# Patient Record
Sex: Female | Born: 1975 | Race: White | Hispanic: No | Marital: Married | Smoking: Current every day smoker
Health system: Southern US, Community
[De-identification: ages and names within clinical notes are randomized; demographics above are authoritative.]

## PROBLEM LIST (undated history)

## (undated) DIAGNOSIS — E119 Type 2 diabetes mellitus without complications: Secondary | ICD-10-CM

## (undated) DIAGNOSIS — M549 Dorsalgia, unspecified: Secondary | ICD-10-CM

## (undated) DIAGNOSIS — E282 Polycystic ovarian syndrome: Secondary | ICD-10-CM

## (undated) DIAGNOSIS — N3010 Interstitial cystitis (chronic) without hematuria: Secondary | ICD-10-CM

## (undated) HISTORY — PX: ABDOMINAL HYSTERECTOMY: SHX81

## (undated) HISTORY — PX: SPINAL FUSION: SHX223

## (undated) HISTORY — DX: Interstitial cystitis (chronic) without hematuria: N30.10

## (undated) HISTORY — DX: Polycystic ovarian syndrome: E28.2

## (undated) HISTORY — DX: Dorsalgia, unspecified: M54.9

## (undated) HISTORY — DX: Type 2 diabetes mellitus without complications: E11.9

---

## 1995-04-11 DIAGNOSIS — L680 Hirsutism: Secondary | ICD-10-CM

## 1995-04-11 HISTORY — DX: Hirsutism: L68.0

## 2020-03-13 ENCOUNTER — Other Ambulatory Visit: Payer: Self-pay

## 2020-03-13 ENCOUNTER — Ambulatory Visit (INDEPENDENT_AMBULATORY_CARE_PROVIDER_SITE_OTHER): Payer: Medicare Other | Admitting: Psychiatry

## 2020-03-13 ENCOUNTER — Encounter: Payer: Self-pay | Admitting: Psychiatry

## 2020-03-13 VITALS — BP 138/87 | HR 92 | Ht 64.0 in | Wt 184.0 lb

## 2020-03-13 DIAGNOSIS — F319 Bipolar disorder, unspecified: Secondary | ICD-10-CM | POA: Diagnosis not present

## 2020-03-13 DIAGNOSIS — F4310 Post-traumatic stress disorder, unspecified: Secondary | ICD-10-CM

## 2020-03-13 DIAGNOSIS — F411 Generalized anxiety disorder: Secondary | ICD-10-CM

## 2020-03-13 DIAGNOSIS — G4700 Insomnia, unspecified: Secondary | ICD-10-CM

## 2020-03-13 MED ORDER — ZOLPIDEM TARTRATE 10 MG PO TABS: 10.0000 mg | ORAL_TABLET | Freq: Every day | ORAL | 2 refills | Status: DC

## 2020-03-13 MED ORDER — LURASIDONE HCL 40 MG PO TABS: 40.0000 mg | ORAL_TABLET | Freq: Every day | ORAL | 0 refills | Status: DC

## 2020-03-13 MED ORDER — LATUDA 20 MG PO TABS: 20.0000 mg | ORAL_TABLET | Freq: Every day | ORAL | 0 refills | Status: DC

## 2020-03-13 MED ORDER — BUSPIRONE HCL 15 MG PO TABS: 15.0000 mg | ORAL_TABLET | Freq: Two times a day (BID) | ORAL | 0 refills | Status: DC

## 2020-03-13 MED ORDER — HYDROXYZINE HCL 25 MG PO TABS: 25.0000 mg | ORAL_TABLET | Freq: Every day | ORAL | 0 refills | Status: DC

## 2020-03-13 MED ORDER — BUPROPION HCL ER (XL) 300 MG PO TB24: 300.0000 mg | ORAL_TABLET | Freq: Every morning | ORAL | 0 refills | Status: DC
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
## 2020-04-10 ENCOUNTER — Encounter: Payer: Self-pay | Admitting: Psychiatry

## 2020-04-10 ENCOUNTER — Other Ambulatory Visit: Payer: Self-pay

## 2020-04-10 ENCOUNTER — Ambulatory Visit (INDEPENDENT_AMBULATORY_CARE_PROVIDER_SITE_OTHER): Payer: Medicare Other | Admitting: Psychiatry

## 2020-04-10 DIAGNOSIS — F319 Bipolar disorder, unspecified: Secondary | ICD-10-CM

## 2020-04-10 MED ORDER — LURASIDONE HCL 40 MG PO TABS: 40.0000 mg | ORAL_TABLET | Freq: Every day | ORAL | 1 refills | Status: DC

## 2020-04-10 NOTE — Progress Notes (Signed)
Julie Abbott [id number] 1976/09/04 43 y.o.  Subjective:   Patient ID:  Julie Abbott is a 44 y.o. (DOB 11-Nov-1976) female.  Chief Complaint:  Chief Complaint  Patient presents with  . Follow-up    h/o Anxiety and mood.     HPI Julie Abbott presents to the office today for follow-up of mood and anxiety. Julie Abbott reports that Julie Abbott is now "even keel" with Latuda and has some affective dulling without any excitement or negative emotions. Julie Abbott reports that Julie Abbott has some pain in her UE joints after starting Latuda. Denies depressed mood. Julie Abbott reports that Julie Abbott does not have any positive emotions. Denies irritability. Julie Abbott reports that Julie Abbott has felt groggy at times and concerned about driving. Goes to bed at 9 pm and sleeps about 12 hours a night. Has been taking Latuda in the morning. Appetite has been about the same. Julie Abbott reports that Julie Abbott has gained 10 lbs. Energy has been ok. Motivation has been ok and follows her routines. Julie Abbott reports impaired concentration and has not been able to complete a book that Julie Abbott wants to read or focus on her devices. Denies SI.   Reports that Julie Abbott has not had any manic s/s in 1.5 weeks. Julie Abbott reports that Julie Abbott had some manic s/s around the time of custody issues with granddaughter. Julie Abbott reports h/o manic s/s with increased stressors.   Has some anxiety about going to oral surgeon tomorrow. Julie Abbott reports that Julie Abbott avoids certain triggers that will cause panic or increased anxiety. Avoids crowds even if it is for something Julie Abbott enjoys. Reports that Julie Abbott has certain routines that need to be followed and will have distress if Julie Abbott cannot complete routine or it gets routine.  Reports that her daughter lost custody of her 57 yo child, so Julie Abbott is concerned Julie Abbott will not be able to see grandchild for years to come. Daughter is pregnant and has been having difficulty with stressors. Julie Abbott reports that Julie Abbott has strained relationship with her mother and called her yesterday on Mother's day.   Reports that  her boyfriend is supportive. AIMS     Office Visit from 04/10/2020 in [facility]  AIMS Total Score  3      Past Psychiatric Medication Trials: Ritalin- Prescribed when Julie Abbott was 44 yo. Adderall- was taking before pain medication. Would have "crash" in the afternoon. Latuda- Was taking in [place] at 80 mg BID. Reports that Latuda seemed to decrease frequency of manic s/s. Depression was less. Denies side effect Vraylar- Ineffective Abilify Risperdal Geodon Seroquel- Increased activation. Lamictal- Tolerated ok. Had some arthralgias. Depakote Trileptal Wellbutrin- Has taken long-term. Was increased to 300 mg po qd in the last 1-2 years. Denies side effects. Helps with decreasing smoking. Citalopram Cymbalta Buspar- Started by PCP in [place] after major stressor before moving to [place]. Thinks it is somewhat helpful for anxiety and rumination. Denies side effective.  Ambien- Most effective for sleep. Causes some impulsivity. Has taken for about 10 years Lunesta- ineffective. Melatonin Hydroxyzine Lyrica- Prescribed for pain Gabapentin-Adverse reaction  Review of Systems:  Review of Systems  Endocrine:       Julie Abbott reports improved glycemic control  Musculoskeletal: Positive for arthralgias. Negative for gait problem.  Neurological: Positive for tremors.  Psychiatric/Behavioral:       Please refer to HPI    Medications: I have reviewed the patient's current medications.  Current Outpatient Medications  Medication Sig Dispense Refill  . buPROPion (WELLBUTRIN XL) 300 MG 24 hr tablet Take 1 tablet (300 mg  total) by mouth every morning. 90 tablet 0  . busPIRone (BUSPAR) 15 MG tablet Take 1 tablet (15 mg total) by mouth 2 (two) times daily. 180 tablet 0  . cyclobenzaprine (FLEXERIL) 10 MG tablet 2 (two) times a day as needed.    [name] ELMIRON 100 MG capsule TAKE 1 CAPSULE BY MOUTH 30 MINUTES BEFORE MEALS    . estradiol (CLIMARA) 0.06 MG/24HR 1 patch once a week.    . hydrOXYzine  (ATARAX/VISTARIL) 25 MG tablet Take 1 tablet (25 mg total) by mouth at bedtime. 90 tablet 0  . LANTUS SOLOSTAR 100 UNIT/ML Solostar Pen SMARTSIG:60 Unit(s) SUB-Q Every Night    . losartan (COZAAR) 25 MG tablet Take by mouth.    . lurasidone (LATUDA) 40 MG TABS tablet Take 1 tablet (40 mg total) by mouth daily with supper. 30 tablet 1  . metFORMIN (GLUCOPHAGE) 500 MG tablet Take by mouth.    . oxyCODONE (OXY IR/ROXICODONE) 5 MG immediate release tablet SMARTSIG:1 Tablet(s) By Mouth 6 Times Daily PRN    . pregabalin (LYRICA) 200 MG capsule Take 200 mg by mouth 3 (three) times daily.    . rosuvastatin (CRESTOR) 10 MG tablet Take by mouth.    . tamsulosin (FLOMAX) 0.4 MG CAPS capsule Take by mouth.    [name] VICTOZA 18 MG/3ML SOPN INJECT 0.6MG  SUBCUTANEOUSLY DAILY FOR 1 WEEK THEN INJECT 1.2MG  DAILY    . zolpidem (AMBIEN) 10 MG tablet Take 1 tablet (10 mg total) by mouth at bedtime. 30 tablet 2   No current facility-administered medications for this visit.    Medication Side Effects: Sedation and Other: Joint aches in upper extremities  Allergies:  Allergies  Allergen Reactions  . Bee Venom   . Gabapentin   . Lortab [Hydrocodone-Acetaminophen]     Past Medical History:  Diagnosis Date  . Back pain   . Diabetes mellitus, type II (HCC)   . Interstitial cystitis   . PCOS (polycystic ovarian syndrome)     Family History  Problem Relation Age of Onset  . Bipolar disorder Daughter   . Anxiety disorder Daughter   . Depression Mother   . Alcohol abuse Mother   . Drug abuse Mother   . OCD Mother   . Panic disorder Mother   . Alcohol abuse Maternal Aunt   . Polycystic ovary syndrome Paternal Aunt   . Alcohol abuse Maternal Uncle   . Alcohol abuse Maternal Grandmother   . Anxiety disorder Maternal Grandmother   . Depression Maternal Grandmother   . Polycystic ovary syndrome Paternal Grandmother     Social History   Socioeconomic History  . Marital status: Unknown    Spouse name: Not  on file  . Number of children: Not on file  . Years of education: Not on file  . Highest education level: Not on file  Occupational History  . Not on file  Tobacco Use  . Smoking status: Current Every Day Smoker    Packs/day: 0.25    Types: Cigarettes  . Smokeless tobacco: Never Used  Substance and Sexual Activity  . Alcohol use: Never  . Drug use: Never  . Sexual activity: Not on file  Other Topics Concern  . Not on file  Social History Narrative  . Not on file   Social Determinants of Health   Financial Resource Strain:   . Difficulty of Paying Living Expenses:   Food Insecurity:   . Worried About Programme researcher, broadcasting/film/video in the Last Year:   .  Ran Out of Food in the Last Year:   Transportation Needs:   . Freight forwarder (Medical):   [name] Lack of Transportation (Non-Medical):   Physical Activity:   . Days of Exercise per Week:   . Minutes of Exercise per Session:   Stress:   . Feeling of Stress :   Social Connections:   . Frequency of Communication with Friends and Family:   . Frequency of Social Gatherings with Friends and Family:   . Attends Religious Services:   . Active Member of Clubs or Organizations:   . Attends Banker Meetings:   [name] Marital Status:   Intimate Partner Violence:   . Fear of Current or Ex-Partner:   . Emotionally Abused:   [name] Physically Abused:   . Sexually Abused:     Past Medical History, Surgical history, Social history, and Family history were reviewed and updated as appropriate.   Please see review of systems for further details on the patient's review from today.   Objective:   Physical Exam:  There were no vitals taken for this visit.  Physical Exam Constitutional:      General: Julie Abbott is not in acute distress. Musculoskeletal:        General: No deformity.  Neurological:     Mental Status: Julie Abbott is alert and oriented to person, place, and time.     Coordination: Coordination normal.  Psychiatric:        Attention  and Perception: Attention and perception normal. Julie Abbott does not perceive auditory or visual hallucinations.        Mood and Affect: Mood is anxious. Mood is not depressed. Affect is not labile, blunt, angry or inappropriate.        Speech: Speech normal.        Behavior: Behavior normal.        Thought Content: Thought content normal. Thought content is not paranoid or delusional. Thought content does not include homicidal or suicidal ideation. Thought content does not include homicidal or suicidal plan.        Cognition and Memory: Cognition and memory normal.        Judgment: Judgment normal.     Comments: Insight intact     Lab Review:  No results found for: NA, K, CL, CO2, GLUCOSE, BUN, CREATININE, CALCIUM, PROT, ALBUMIN, AST, ALT, ALKPHOS, BILITOT, GFRNONAA, GFRAA  No results found for: WBC, RBC, HGB, HCT, PLT, MCV, MCH, MCHC, RDW, LYMPHSABS, MONOABS, EOSABS, BASOSABS  No results found for: POCLITH, LITHIUM   No results found for: PHENYTOIN, PHENOBARB, VALPROATE, CBMZ   .res Assessment: Plan:   Julie Abbott reports that Julie Abbott is getting some benefits with Latuda and is also noticing some side effects. Julie Abbott reports that Julie Abbott would like to continue Latuda 40 mg po qd for another month to determine if side effects resolve like they have in the past with Latuda. Continue Latuda 40 mg po qd for mood s/s. Will continue Wellbutrin XL 300 mg po qd for depression. Continue Buspar 15 mg po BID for anxiety.  Continue Hydroxyzine 25 mg po QHS for insomnia and anxiety.  Continue Ambien 10 mg po QHS for insomnia.  Julie Abbott to f/u in 4 weeks or sooner if clinically indicated.  Patient advised to contact office with any questions, adverse effects, or acute worsening in signs and symptoms.  Julie Abbott was seen today for follow-up.  Diagnoses and all orders for this visit:  Bipolar affective disorder, remission status unspecified (HCC) -  lurasidone (LATUDA) 40 MG TABS tablet; Take 1 tablet (40 mg total) by mouth  daily with supper.     Please see After Visit Summary for patient specific instructions.  Future Appointments  Date Time Provider [place]  05/08/2020  8:30 AM Thayer Headings, PMHNP [facility] None    No orders of the defined types were placed in this encounter.   -------------------------------

## 2020-04-10 NOTE — Progress Notes (Signed)
Reports long-standing familial tremor and denies worsening tremor.  04/10/20 1015  Facial and Oral Movements  Muscles of Facial Expression 0  Lips and Perioral Area 0  Jaw 0  Tongue 0  Extremity Movements  Upper (arms, wrists, hands, fingers) 1  Lower (legs, knees, ankles, toes) 0  Trunk Movements  Neck, shoulders, hips 0  Overall Severity  Severity of abnormal movements (highest score from questions above) 0  Incapacitation due to abnormal movements 0  Patient's awareness of abnormal movements (rate only patient's report) 2  AIMS Total Score  AIMS Total Score 3

## 2020-05-08 ENCOUNTER — Ambulatory Visit: Payer: Medicare Other | Admitting: Psychiatry

## 2020-06-06 ENCOUNTER — Telehealth: Payer: Self-pay | Admitting: Psychiatry

## 2020-06-06 NOTE — Telephone Encounter (Signed)
Pt will need a RF of generic Lyrica 600mg  per day. ( from a previous doctor) Please send in to [facility] in [place], [place].

## 2020-06-09 ENCOUNTER — Telehealth: Payer: Self-pay | Admitting: Psychiatry

## 2020-06-09 ENCOUNTER — Other Ambulatory Visit: Payer: Self-pay | Admitting: Physician Assistant

## 2020-06-09 MED ORDER — PREGABALIN 200 MG PO CAPS: 200.0000 mg | ORAL_CAPSULE | Freq: Three times a day (TID) | ORAL | 0 refills | Status: DC

## 2020-06-09 NOTE — Telephone Encounter (Signed)
Pt is completely out would like a rx for lyrica 200mg  tid sent in. She use to get it from another provider but it was discussed that she could get it here. Please send to [facility] in [place].

## 2020-06-09 NOTE — Telephone Encounter (Signed)
I didn't see it either, but I've sent in a 10 day supply, will send this so Shanda Bumps can review it and make decision about further Rx.

## 2020-06-12 NOTE — Telephone Encounter (Signed)
Noted thank you

## 2020-06-23 ENCOUNTER — Other Ambulatory Visit: Payer: Self-pay

## 2020-06-23 ENCOUNTER — Encounter: Payer: Self-pay | Admitting: Psychiatry

## 2020-06-23 ENCOUNTER — Ambulatory Visit (INDEPENDENT_AMBULATORY_CARE_PROVIDER_SITE_OTHER): Payer: Medicare Other | Admitting: Psychiatry

## 2020-06-23 DIAGNOSIS — G4700 Insomnia, unspecified: Secondary | ICD-10-CM

## 2020-06-23 DIAGNOSIS — F4310 Post-traumatic stress disorder, unspecified: Secondary | ICD-10-CM

## 2020-06-23 DIAGNOSIS — F319 Bipolar disorder, unspecified: Secondary | ICD-10-CM | POA: Diagnosis not present

## 2020-06-23 MED ORDER — BUSPIRONE HCL 15 MG PO TABS: 15.0000 mg | ORAL_TABLET | Freq: Two times a day (BID) | ORAL | 0 refills | Status: DC

## 2020-06-23 MED ORDER — PREGABALIN 200 MG PO CAPS: 200.0000 mg | ORAL_CAPSULE | Freq: Three times a day (TID) | ORAL | 0 refills | Status: DC

## 2020-06-23 MED ORDER — ZOLPIDEM TARTRATE 10 MG PO TABS: 10.0000 mg | ORAL_TABLET | Freq: Every day | ORAL | 2 refills | Status: DC

## 2020-06-23 MED ORDER — LURASIDONE HCL 40 MG PO TABS: 40.0000 mg | ORAL_TABLET | Freq: Every day | ORAL | 2 refills | Status: DC

## 2020-06-23 MED ORDER — BUPROPION HCL ER (XL) 300 MG PO TB24: 300.0000 mg | ORAL_TABLET | Freq: Every morning | ORAL | 0 refills | Status: DC

## 2020-06-23 NOTE — Progress Notes (Signed)
Julie Abbott [id number] October 29, 1976 44 y.o.  Subjective:   Patient ID:  Julie Abbott is a 44 y.o. (DOB 09-15-1976) female.  Chief Complaint:  Chief Complaint  Patient presents with  . Anxiety  . Depression  . Insomnia    HPI Julie Abbott presents to the office today for follow-up of mood disturbance and anxiety. She reports that her pain management specialist is concerned about the combination of controlled substances. She reports that she has been taking Lyrica, Ambien, and oxycodone long-term She reports that in the past she has had manic s/s when she does not have Ambien and Lyrica. She reports that Lyrica has been helpful for her anxiety and neuropathy. She reports increased anxiety in response to concerns about medications. She reports that she has been pulling out her eyebrows. Has not been able to sleep for the last 3 nights. She reports that she is likely experiencing early onset mania. She reports, "I have depression all the time." She reports that she has difficulty trusting others due to past traumatic experiences. She reports that her anxiety escalates when there are changes in her routine and new stressors. Appetite has been increased at time and reports that she has "emotional eating." Reports some weight gain. She reports poor concentration. Reports that she has been getting dates and appointments mixed up. She reports poor energy and motivation. Reports that she has been frequently pacing. Denies any risky behavior or impulsivity. Denies SI.   Reports that daughter lost custody of pt's granddaughter and she has not been able to see her in over a year.    Past Psychiatric Medication Trials: Ritalin- Prescribed when she was 44 yo. Adderall- was taking before pain medication. Would have "crash" in the afternoon. Latuda- Was [place] in [place] at 80 mg BID. Reports that Latuda seemed to decrease frequency of manic s/s. Depression was less. Denies side effect Vraylar-  Ineffective Abilify Risperdal Geodon Seroquel- Increased activation. Lamictal- Tolerated ok. Had some arthralgias. Depakote Trileptal Wellbutrin- Has taken long-term. Was increased to 300 mg po qd in the last 1-2 years. Denies side effects. Helps with decreasing smoking. Citalopram Cymbalta Buspar- Started by PCP in [place] after major stressor before moving to [place]. Thinks it is somewhat helpful for anxiety and rumination. Denies side effective.  Ambien- Most effective for sleep. Causes some impulsivity. Has taken for about 10 years Lunesta- ineffective. Melatonin Hydroxyzine Lyrica- Prescribed for pain Gabapentin-Adverse reaction  AIMS     Office Visit from 04/10/2020 in [facility]  AIMS Total Score 3       Review of Systems:  Review of Systems  Gastrointestinal: Positive for constipation.  Musculoskeletal: Negative for gait problem.  Skin:       Itching  Neurological: Positive for tremors.       Neuropathy  Psychiatric/Behavioral:       Please refer to HPI    Medications: I have reviewed the patient's current medications.  Current Outpatient Medications  Medication Sig Dispense Refill  . buPROPion (WELLBUTRIN XL) 300 MG 24 hr tablet Take 1 tablet (300 mg total) by mouth every morning. 90 tablet 0  . cyclobenzaprine (FLEXERIL) 10 MG tablet 2 (two) times a day as needed.    [name] ELMIRON 100 MG capsule TAKE 1 CAPSULE BY MOUTH 30 MINUTES BEFORE MEALS    . estradiol (CLIMARA) 0.06 MG/24HR 1 patch once a week.    [name] LANTUS SOLOSTAR 100 UNIT/ML Solostar Pen SMARTSIG:60 Unit(s) SUB-Q Every Night    . losartan (COZAAR) 25 MG  tablet Take by mouth.    . lurasidone (LATUDA) 40 MG TABS tablet Take 1 tablet (40 mg total) by mouth daily with supper. 30 tablet 2  . metFORMIN (GLUCOPHAGE) 500 MG tablet Take by mouth.    . oxyCODONE (OXY IR/ROXICODONE) 5 MG immediate release tablet SMARTSIG:1 Tablet(s) By Mouth 6 Times Daily PRN    . pregabalin (LYRICA) 200 MG capsule Take 1  capsule (200 mg total) by mouth 3 (three) times daily. 270 capsule 0  . rosuvastatin (CRESTOR) 10 MG tablet Take by mouth.    . tamsulosin (FLOMAX) 0.4 MG CAPS capsule Take by mouth.    [name] VICTOZA 18 MG/3ML SOPN INJECT 0.6MG  SUBCUTANEOUSLY DAILY FOR 1 WEEK THEN INJECT 1.2MG  DAILY    . [START ON 07/03/2020] zolpidem (AMBIEN) 10 MG tablet Take 1 tablet (10 mg total) by mouth at bedtime. 30 tablet 2  . busPIRone (BUSPAR) 15 MG tablet Take 1 tablet (15 mg total) by mouth 2 (two) times daily. 180 tablet 0   No current facility-administered medications for this visit.    Medication Side Effects: None  Allergies:  Allergies  Allergen Reactions  . Bee Venom   . Gabapentin   . Lortab [Hydrocodone-Acetaminophen]     Past Medical History:  Diagnosis Date  . Back pain   . Diabetes mellitus, type II (HCC)   . Interstitial cystitis   . PCOS (polycystic ovarian syndrome)     Family History  Problem Relation Age of Onset  . Bipolar disorder Daughter   . Anxiety disorder Daughter   . Depression Mother   . Alcohol abuse Mother   . Drug abuse Mother   . OCD Mother   . Panic disorder Mother   . Alcohol abuse Maternal Aunt   . Polycystic ovary syndrome Paternal Aunt   . Alcohol abuse Maternal Uncle   . Alcohol abuse Maternal Grandmother   . Anxiety disorder Maternal Grandmother   . Depression Maternal Grandmother   . Polycystic ovary syndrome Paternal Grandmother     Social History   Socioeconomic History  . Marital status: Unknown    Spouse name: Not on file  . Number of children: Not on file  . Years of education: Not on file  . Highest education level: Not on file  Occupational History  . Not on file  Tobacco Use  . Smoking status: Current Every Day Smoker    Packs/day: 0.25    Types: Cigarettes  . Smokeless tobacco: Never Used  Substance and Sexual Activity  . Alcohol use: Never  . Drug use: Never  . Sexual activity: Not on file  Other Topics Concern  . Not on file   Social History Narrative  . Not on file   Social Determinants of Health   Financial Resource Strain:   . Difficulty of Paying Living Expenses:   Food Insecurity:   . Worried About Programme researcher, broadcasting/film/video in the Last Year:   . Barista in the Last Year:   Transportation Needs:   . Freight forwarder (Medical):   [name] Lack of Transportation (Non-Medical):   Physical Activity:   . Days of Exercise per Week:   . Minutes of Exercise per Session:   Stress:   . Feeling of Stress :   Social Connections:   . Frequency of Communication with Friends and Family:   . Frequency of Social Gatherings with Friends and Family:   . Attends Religious Services:   . Active Member of Clubs or  Organizations:   . Attends Banker Meetings:   [name] Marital Status:   Intimate Partner Violence:   . Fear of Current or Ex-Partner:   . Emotionally Abused:   [name] Physically Abused:   . Sexually Abused:     Past Medical History, Surgical history, Social history, and Family history were reviewed and updated as appropriate.   Please see review of systems for further details on the patient's review from today.   Objective:   Physical Exam:  BP (!) 149/104   Pulse (!) 106   Physical Exam Constitutional:      General: She is not in acute distress. Musculoskeletal:        General: No deformity.  Neurological:     Mental Status: She is alert and oriented to person, place, and time.     Coordination: Coordination normal.  Psychiatric:        Attention and Perception: Attention and perception normal. She does not perceive auditory or visual hallucinations.        Mood and Affect: Mood is anxious. Mood is not depressed. Affect is not labile, blunt, angry or inappropriate.        Speech: Speech normal.        Behavior: Behavior normal.        Thought Content: Thought content normal. Thought content is not paranoid or delusional. Thought content does not include homicidal or suicidal ideation.  Thought content does not include homicidal or suicidal plan.        Cognition and Memory: Cognition and memory normal.        Judgment: Judgment normal.     Comments: Insight intact Dysphoric mood     Lab Review:  No results found for: NA, K, CL, CO2, GLUCOSE, BUN, CREATININE, CALCIUM, PROT, ALBUMIN, AST, ALT, ALKPHOS, BILITOT, GFRNONAA, GFRAA  No results found for: WBC, RBC, HGB, HCT, PLT, MCV, MCH, MCHC, RDW, LYMPHSABS, MONOABS, EOSABS, BASOSABS  No results found for: POCLITH, LITHIUM   No results found for: PHENYTOIN, PHENOBARB, VALPROATE, CBMZ   .res Assessment: Plan:   Patient reports that her pain management specialist is requesting documentation that benefits of sedative medication outweigh potential risks.  Letter sent to pain management specialist indicating that patient is being seen in this office for treatment of bipolar disorder, PTSD, and insomnia and is being prescribed Lyrica and Ambien.  Recommend continuing Lyrica for treatment of anxiety and mood stabilization.  Also recommend continuing Ambien 10 mg at bedtime since patient reports sleeplessness without Ambien, which then  precipitates manic signs and symptoms.  Benefits of continuing these medications determined to outweigh potential risks at this time.  Patient has reportedly taken these medications time without difficulty and is not exhibiting any signs of CNS depression on exam, as evidenced by elevated blood pressure and heart rate today. Continue Lyrica 200 mg 3 times daily for anxiety, mood, and insomnia. Continue Ambien 10 mg at bedtime for insomnia. Continue BuSpar 15 mg twice daily for anxiety. Continue Wellbutrin XL 300 mg daily for depression. Continue Latuda 40 mg daily with evening mood stabilization. Patient to follow-up in 4 weeks or sooner if clinically indicated. Patient advised to contact office with any questions, adverse effects, or acute worsening in signs and symptoms.   Please see After Visit  Summary for patient specific instructions.  Future Appointments  Date Time Provider Department Center  08/01/2020  9:30 AM Corie Chiquito, PMHNP CP-CP None    No orders of the defined types were placed in  this encounter.   -------------------------------

## 2020-08-01 ENCOUNTER — Ambulatory Visit (INDEPENDENT_AMBULATORY_CARE_PROVIDER_SITE_OTHER): Payer: Medicare Other | Admitting: Psychiatry

## 2020-08-01 ENCOUNTER — Other Ambulatory Visit: Payer: Self-pay

## 2020-08-01 ENCOUNTER — Encounter: Payer: Self-pay | Admitting: Psychiatry

## 2020-08-01 DIAGNOSIS — F4310 Post-traumatic stress disorder, unspecified: Secondary | ICD-10-CM

## 2020-08-01 DIAGNOSIS — F319 Bipolar disorder, unspecified: Secondary | ICD-10-CM | POA: Diagnosis not present

## 2020-08-01 MED ORDER — BUSPIRONE HCL 15 MG PO TABS: 15.0000 mg | ORAL_TABLET | Freq: Two times a day (BID) | ORAL | 0 refills | Status: DC

## 2020-08-01 MED ORDER — BUPROPION HCL ER (XL) 300 MG PO TB24: 300.0000 mg | ORAL_TABLET | Freq: Every morning | ORAL | 0 refills | Status: DC

## 2020-08-01 MED ORDER — LURASIDONE HCL 60 MG PO TABS: 60.0000 mg | ORAL_TABLET | Freq: Every day | ORAL | 1 refills | Status: DC

## 2020-08-01 NOTE — Progress Notes (Signed)
Julie Abbott [id number] 12-27-1975 44 y.o.  Subjective:   Patient ID:  Julie Abbott is a 44 y.o. (DOB 30-Oct-1976) female.  Chief Complaint:  Chief Complaint  Patient presents with  . Anxiety  . Depression    HPI Julie Abbott presents to the office today for follow-up of mood disturbance and anxiety. Daughter goes to court re: custody of pt's granddaughter this week. She reports that there has been a recommendation that grandchild be removed from both biological parents and pt reports that she feels as if she is grieving the loss of her grandchild. She reports that she has been ruminating about this situation. She reports that she is awakening in the middle of the night hearing her grandchild scream. She reports that she has been having some panic attacks. "There's no way I can be happy, really happy, until she comes home." She reports that her mood has been depressed. She reports that she is able to sleep some. She reports that she is able to fall asleep and then awakens in the middle of the night. She reports that she is gaining wt despite not eating. She reports that her energy and motivation have been lower to go places and feels that she needs to stay home in case there is a family emergency. She reports poor concentration and focus. Reports that she is losing focus when trying to do tasks. Has not been able to read and that usually relaxes her. Denies manic s/s. Denies SI.   Past Psychiatric Medication Trials: Ritalin- Prescribed when she was 44 yo. Adderall- was taking before pain medication. Would have "crash" in the afternoon. Latuda- Was taking in [place] at 80 mg BID. Reports that Latuda seemed to decrease frequency of manic s/s. Depression was less. Denies side effects Vraylar- Ineffective Abilify Risperdal Geodon Seroquel- Increased activation. Lamictal- Tolerated ok. Had some arthralgias. Depakote Trileptal Wellbutrin- Has taken long-term. Was increased to 300 mg po qd in  the last 1-2 years. Denies side effects. Helps with decreasing smoking. Citalopram Cymbalta Buspar- Started by PCP in [place] after major stressor before moving to [place]. Thinks it is somewhat helpful for anxiety and rumination. Denies side effective.  Ambien- Most effective for sleep. Causes some impulsivity. Has taken for about 10 years Lunesta- ineffective. Melatonin Hydroxyzine Lyrica- Prescribed for pain Gabapentin-Adverse reaction  AIMS     Office Visit from 04/10/2020 in [facility]  AIMS Total Score 45       Review of Systems:  Review of Systems  Musculoskeletal: Positive for arthralgias and myalgias. Negative for gait problem.  Neurological: Positive for tremors.  Psychiatric/Behavioral:       Please refer to HPI    Medications: I have reviewed the patient's current medications.  Current Outpatient Medications  Medication Sig Dispense Refill  . buPROPion (WELLBUTRIN XL) 300 MG 24 hr tablet Take 1 tablet (300 mg total) by mouth every morning. 90 tablet 0  . busPIRone (BUSPAR) 15 MG tablet Take 1 tablet (15 mg total) by mouth 2 (two) times daily. 180 tablet 0  . cyclobenzaprine (FLEXERIL) 10 MG tablet 2 (two) times a day as needed.    [name] ELMIRON 100 MG capsule TAKE 1 CAPSULE BY MOUTH 30 MINUTES BEFORE MEALS    . estradiol (CLIMARA) 0.06 MG/24HR 1 patch once a week.    [name] LANTUS SOLOSTAR 100 UNIT/ML Solostar Pen SMARTSIG:60 Unit(s) SUB-Q Every Night    . LINZESS 72 MCG capsule Take 72 mcg by mouth daily.    [name] losartan (COZAAR)  25 MG tablet Take by mouth.    . Lurasidone HCl (LATUDA) 60 MG TABS Take 1 tablet (60 mg total) by mouth daily with supper. 30 tablet 1  . oxyCODONE (OXY IR/ROXICODONE) 5 MG immediate release tablet SMARTSIG:1 Tablet(s) By Mouth 6 Times Daily PRN    . OZEMPIC, 1 MG/DOSE, 4 MG/3ML SOPN INJECT 1 MG ONCE A WEEK SAME DAY EACH WEEK FOR DIABETES    . pregabalin (LYRICA) 200 MG capsule Take 1 capsule (200 mg total) by mouth 3 (three) times daily. 270  capsule 0  . rosuvastatin (CRESTOR) 10 MG tablet Take by mouth.    . tamsulosin (FLOMAX) 0.4 MG CAPS capsule Take by mouth.    . zolpidem (AMBIEN) 10 MG tablet Take 1 tablet (10 mg total) by mouth at bedtime. 30 tablet 2   No current facility-administered medications for this visit.    Medication Side Effects: None  Allergies:  Allergies  Allergen Reactions  . Bee Venom   . Gabapentin   . Lortab [Hydrocodone-Acetaminophen]     Past Medical History:  Diagnosis Date  . Back pain   . Diabetes mellitus, type II (HCC)   . Interstitial cystitis   . PCOS (polycystic ovarian syndrome)     Family History  Problem Relation Age of Onset  . Bipolar disorder Daughter   . Anxiety disorder Daughter   . Depression Mother   . Alcohol abuse Mother   . Drug abuse Mother   . OCD Mother   . Panic disorder Mother   . Alcohol abuse Maternal Aunt   . Polycystic ovary syndrome Paternal Aunt   . Alcohol abuse Maternal Uncle   . Alcohol abuse Maternal Grandmother   . Anxiety disorder Maternal Grandmother   . Depression Maternal Grandmother   . Polycystic ovary syndrome Paternal Grandmother     Social History   Socioeconomic History  . Marital status: Unknown    Spouse name: Not on file  . Number of children: Not on file  . Years of education: Not on file  . Highest education level: Not on file  Occupational History  . Not on file  Tobacco Use  . Smoking status: Current Every Day Smoker    Packs/day: 0.25    Types: Cigarettes  . Smokeless tobacco: Never Used  Substance and Sexual Activity  . Alcohol use: Never  . Drug use: Never  . Sexual activity: Not on file  Other Topics Concern  . Not on file  Social History Narrative  . Not on file   Social Determinants of Health   Financial Resource Strain:   . Difficulty of Paying Living Expenses: Not on file  Food Insecurity:   . Worried About Programme researcher, broadcasting/film/video in the Last Year: Not on file  . Ran Out of Food in the Last Year:  Not on file  Transportation Needs:   . Lack of Transportation (Medical): Not on file  . Lack of Transportation (Non-Medical): Not on file  Physical Activity:   . Days of Exercise per Week: Not on file  . Minutes of Exercise per Session: Not on file  Stress:   . Feeling of Stress : Not on file  Social Connections:   . Frequency of Communication with Friends and Family: Not on file  . Frequency of Social Gatherings with Friends and Family: Not on file  . Attends Religious Services: Not on file  . Active Member of Clubs or Organizations: Not on file  . Attends Banker  Meetings: Not on file  . Marital Status: Not on file  Intimate Partner Violence:   . Fear of Current or Ex-Partner: Not on file  . Emotionally Abused: Not on file  . Physically Abused: Not on file  . Sexually Abused: Not on file    Past Medical History, Surgical history, Social history, and Family history were reviewed and updated as appropriate.   Please see review of systems for further details on the patient's review from today.   Objective:   Physical Exam:  Wt 191 lb (86.6 kg)   BMI 32.79 kg/m   Physical Exam Constitutional:      General: She is not in acute distress. Musculoskeletal:        General: No deformity.  Neurological:     Mental Status: She is alert and oriented to person, place, and time.     Coordination: Coordination normal.  Psychiatric:        Attention and Perception: Perception normal. She is inattentive. She does not perceive auditory or visual hallucinations.        Mood and Affect: Mood is anxious and depressed. Affect is not labile, blunt, angry or inappropriate.        Speech: Speech normal.        Behavior: Behavior normal.        Thought Content: Thought content normal. Thought content is not paranoid or delusional. Thought content does not include homicidal or suicidal ideation. Thought content does not include homicidal or suicidal plan.        Cognition and  Memory: Cognition and memory normal.        Judgment: Judgment normal.     Comments: Insight intact     Lab Review:  No results found for: NA, K, CL, CO2, GLUCOSE, BUN, CREATININE, CALCIUM, PROT, ALBUMIN, AST, ALT, ALKPHOS, BILITOT, GFRNONAA, GFRAA  No results found for: WBC, RBC, HGB, HCT, PLT, MCV, MCH, MCHC, RDW, LYMPHSABS, MONOABS, EOSABS, BASOSABS  No results found for: POCLITH, LITHIUM   No results found for: PHENYTOIN, PHENOBARB, VALPROATE, CBMZ   .res Assessment: Plan:   Will increase Latuda to 60 mg po qd to possibly improve anxiety and to prevent recurrence of manic s/s.  Will continue all other medications as prescribed.  Pt to f/u in 4 weeks or sooner if clinically indicated.  Patient advised to contact office with any questions, adverse effects, or acute worsening in signs and symptoms.  Jaxsyn was seen today for anxiety and depression.  Diagnoses and all orders for this visit:  Bipolar affective disorder, remission status unspecified (HCC) -     Lurasidone HCl (LATUDA) 60 MG TABS; Take 1 tablet (60 mg total) by mouth daily with supper. -     buPROPion (WELLBUTRIN XL) 300 MG 24 hr tablet; Take 1 tablet (300 mg total) by mouth every morning.  PTSD (post-traumatic stress disorder) -     busPIRone (BUSPAR) 15 MG tablet; Take 1 tablet (15 mg total) by mouth 2 (two) times daily.     Please see After Visit Summary for patient specific instructions.  Future Appointments  Date Time Provider Department Center  08/29/2020  1:45 PM Corie Chiquito, PMHNP [facility] None    No orders of the defined types were placed in this encounter.   -------------------------------

## 2020-08-29 ENCOUNTER — Encounter (INDEPENDENT_AMBULATORY_CARE_PROVIDER_SITE_OTHER): Payer: Self-pay

## 2020-08-29 ENCOUNTER — Encounter: Payer: Self-pay | Admitting: Psychiatry

## 2020-08-29 ENCOUNTER — Other Ambulatory Visit: Payer: Self-pay

## 2020-08-29 ENCOUNTER — Ambulatory Visit (INDEPENDENT_AMBULATORY_CARE_PROVIDER_SITE_OTHER): Payer: Medicare Other | Admitting: Psychiatry

## 2020-08-29 DIAGNOSIS — G4700 Insomnia, unspecified: Secondary | ICD-10-CM | POA: Diagnosis not present

## 2020-08-29 DIAGNOSIS — F319 Bipolar disorder, unspecified: Secondary | ICD-10-CM

## 2020-08-29 DIAGNOSIS — F4310 Post-traumatic stress disorder, unspecified: Secondary | ICD-10-CM

## 2020-08-29 DIAGNOSIS — F411 Generalized anxiety disorder: Secondary | ICD-10-CM | POA: Diagnosis not present

## 2020-08-29 MED ORDER — ZOLPIDEM TARTRATE 10 MG PO TABS: 10.0000 mg | ORAL_TABLET | Freq: Every day | ORAL | 2 refills | Status: DC

## 2020-08-29 MED ORDER — BUPROPION HCL ER (XL) 300 MG PO TB24: 300.0000 mg | ORAL_TABLET | Freq: Every morning | ORAL | 0 refills | Status: DC

## 2020-08-29 MED ORDER — LATUDA 60 MG PO TABS: 60.0000 mg | ORAL_TABLET | Freq: Every day | ORAL | 1 refills | Status: DC

## 2020-08-29 MED ORDER — PREGABALIN 200 MG PO CAPS: 200.0000 mg | ORAL_CAPSULE | Freq: Three times a day (TID) | ORAL | 2 refills | Status: DC

## 2020-08-29 MED ORDER — HYDROXYZINE HCL 25 MG PO TABS: 25.0000 mg | ORAL_TABLET | Freq: Every day | ORAL | 1 refills | Status: DC

## 2020-08-29 MED ORDER — BUSPIRONE HCL 15 MG PO TABS: 15.0000 mg | ORAL_TABLET | Freq: Two times a day (BID) | ORAL | 0 refills | Status: DC

## 2020-08-29 NOTE — Progress Notes (Signed)
Julie Abbott [id number] 07-03-1976 44 y.o.  Subjective:   Patient ID:  Julie Abbott is a 44 y.o. (DOB 1976/05/24) female.  Chief Complaint:  Chief Complaint  Patient presents with  . Insomnia  . Anxiety  . Other    Mood disturbance    HPI [name] presents to the office today for follow-up of mood disturbance and anxiety. She reports that custody issues are still pending re: her granddaughter and there is a court date on 09/08/20. She reports that she continues to have anxiety about her granddaughter and describes rumination. She notices a tendency to want to control and has been calling her daughter frequently to tell her or remind her of things. Denies panic attacks.   She reports that she has been more tired on Latuda 60 mg qd. She reports that her mood has been somewhat flat. She reports that she has not had as much agitation- "less outward, just keeping it in." She reports sleep disturbance in response to current stressors and racing thoughts. Reports sleeping about 2-3 hours a night. She reports energy is fair. Motivation is low. Concentration is poor. She reports that she has not been eating very much and her boyfriend encourages her to eat. She reports that she tends to eat after taking HS meds. She reports that she is having difficulty with word finding or saying the name of someone she knows. Denies SI.  Denies any recent manic s/s, other than diminished sleep.    She reports that pain management specialist would like for Lyrica to be prescribed as a 30 day script instead of 90 day.   Past Psychiatric Medication Trials: Ritalin- Prescribed when she was 44 yo. Adderall- was taking before pain medication. Would have "crash" in the afternoon. Latuda- Was taking in [place] at 80 mg BID. Reports that Latuda seemed to decrease frequency of manic s/s. Depression was less. Denies side effects Vraylar- Ineffective Abilify Risperdal Geodon Seroquel- Increased  activation. Lamictal- Tolerated ok. Had some arthralgias. Depakote Trileptal Wellbutrin- Has taken long-term. Was increased to 300 mg po qd in the last 1-2 years. Denies side effects. Helps with decreasing smoking. Citalopram Cymbalta Buspar- Started by PCP in [place] after major stressor before moving to [place]. Thinks it is somewhat helpful for anxiety and rumination. Denies side effective.  Ambien- Most effective for sleep. Causes some impulsivity. Has taken for about 10 years Lunesta- ineffective. Melatonin Hydroxyzine Lyrica- Prescribed for pain Gabapentin-Adverse reaction  AIMS     Office Visit from 04/10/2020 in [facility]  AIMS Total Score 3       Review of Systems:  Review of Systems  Musculoskeletal: Positive for arthralgias and neck pain. Negative for gait problem.       Shoulder pain.   Neurological: Positive for tremors.       Worsening tremor in left arm and is not sure if this correlates with increase in Latuda.   Psychiatric/Behavioral:       Please refer to HPI    Medications: I have reviewed the patient's current medications.  Current Outpatient Medications  Medication Sig Dispense Refill  . buPROPion (WELLBUTRIN XL) 300 MG 24 hr tablet Take 1 tablet (300 mg total) by mouth every morning. 90 tablet 0  . busPIRone (BUSPAR) 15 MG tablet Take 1 tablet (15 mg total) by mouth 2 (two) times daily. 180 tablet 0  . cyclobenzaprine (FLEXERIL) 10 MG tablet 2 (two) times a day as needed.    [name] ELMIRON 100 MG capsule TAKE 1  CAPSULE BY MOUTH 30 MINUTES BEFORE MEALS    . estradiol (CLIMARA) 0.06 MG/24HR 1 patch once a week.    . hydrOXYzine (ATARAX/VISTARIL) 25 MG tablet Take 1 tablet (25 mg total) by mouth at bedtime. 90 tablet 1  . LANTUS SOLOSTAR 100 UNIT/ML Solostar Pen SMARTSIG:60 Unit(s) SUB-Q Every Night    . LINZESS 72 MCG capsule Take 72 mcg by mouth daily.    [name]. losartan (COZAAR) 25 MG tablet Take by mouth.    . Lurasidone HCl (LATUDA) 60 MG TABS Take 1  tablet (60 mg total) by mouth daily with supper. 30 tablet 1  . oxyCODONE (OXY IR/ROXICODONE) 5 MG immediate release tablet SMARTSIG:1 Tablet(s) By Mouth 6 Times Daily PRN    . OZEMPIC, 1 MG/DOSE, 4 MG/3ML SOPN INJECT 1 MG ONCE A WEEK SAME DAY EACH WEEK FOR DIABETES    . [START ON 09/18/2020] pregabalin (LYRICA) 200 MG capsule Take 1 capsule (200 mg total) by mouth 3 (three) times daily. 90 capsule 2  . rosuvastatin (CRESTOR) 10 MG tablet Take by mouth.    . tamsulosin (FLOMAX) 0.4 MG CAPS capsule Take by mouth.    [name]. [START ON 09/30/2020] zolpidem (AMBIEN) 10 MG tablet Take 1 tablet (10 mg total) by mouth at bedtime. 30 tablet 2   No current facility-administered medications for this visit.    Medication Side Effects: Other: Possible tremor  Allergies:  Allergies  Allergen Reactions  . Bee Venom   . Gabapentin   . Lortab [Hydrocodone-Acetaminophen]     Past Medical History:  Diagnosis Date  . Back pain   . Diabetes mellitus, type II (HCC)   . Interstitial cystitis   . PCOS (polycystic ovarian syndrome)     Family History  Problem Relation Age of Onset  . Bipolar disorder Daughter   . Anxiety disorder Daughter   . Depression Mother   . Alcohol abuse Mother   . Drug abuse Mother   . OCD Mother   . Panic disorder Mother   . Alcohol abuse Maternal Aunt   . Polycystic ovary syndrome Paternal Aunt   . Alcohol abuse Maternal Uncle   . Alcohol abuse Maternal Grandmother   . Anxiety disorder Maternal Grandmother   . Depression Maternal Grandmother   . Polycystic ovary syndrome Paternal Grandmother     Social History   Socioeconomic History  . Marital status: Unknown    Spouse name: Not on file  . Number of children: Not on file  . Years of education: Not on file  . Highest education level: Not on file  Occupational History  . Not on file  Tobacco Use  . Smoking status: Current Every Day Smoker    Packs/day: 0.25    Types: Cigarettes  . Smokeless tobacco: Never Used   Substance and Sexual Activity  . Alcohol use: Never  . Drug use: Never  . Sexual activity: Not on file  Other Topics Concern  . Not on file  Social History Narrative  . Not on file   Social Determinants of Health   Financial Resource Strain:   . Difficulty of Paying Living Expenses: Not on file  Food Insecurity:   . Worried About Programme researcher, broadcasting/film/videounning Out of Food in the Last Year: Not on file  . Ran Out of Food in the Last Year: Not on file  Transportation Needs:   . Lack of Transportation (Medical): Not on file  . Lack of Transportation (Non-Medical): Not on file  Physical Activity:   . Days  of Exercise per Week: Not on file  . Minutes of Exercise per Session: Not on file  Stress:   . Feeling of Stress : Not on file  Social Connections:   . Frequency of Communication with Friends and Family: Not on file  . Frequency of Social Gatherings with Friends and Family: Not on file  . Attends Religious Services: Not on file  . Active Member of Clubs or Organizations: Not on file  . Attends Banker Meetings: Not on file  . Marital Status: Not on file  Intimate Partner Violence:   . Fear of Current or Ex-Partner: Not on file  . Emotionally Abused: Not on file  . Physically Abused: Not on file  . Sexually Abused: Not on file    Past Medical History, Surgical history, Social history, and Family history were reviewed and updated as appropriate.   Please see review of systems for further details on the patient's review from today.   Objective:   Physical Exam:  There were no vitals taken for this visit.  Physical Exam Constitutional:      General: She is not in acute distress. Musculoskeletal:        General: No deformity.  Neurological:     Mental Status: She is alert and oriented to person, place, and time.     Coordination: Coordination normal.  Psychiatric:        Attention and Perception: Attention and perception normal. She does not perceive auditory or visual  hallucinations.        Mood and Affect: Mood is anxious. Mood is not depressed. Affect is not labile, blunt, angry or inappropriate.        Speech: Speech normal.        Behavior: Behavior normal.        Thought Content: Thought content normal. Thought content is not paranoid or delusional. Thought content does not include homicidal or suicidal ideation. Thought content does not include homicidal or suicidal plan.        Cognition and Memory: Cognition and memory normal.        Judgment: Judgment normal.     Comments: Insight intact     Lab Review:  No results found for: NA, K, CL, CO2, GLUCOSE, BUN, CREATININE, CALCIUM, PROT, ALBUMIN, AST, ALT, ALKPHOS, BILITOT, GFRNONAA, GFRAA  No results found for: WBC, RBC, HGB, HCT, PLT, MCV, MCH, MCHC, RDW, LYMPHSABS, MONOABS, EOSABS, BASOSABS  No results found for: POCLITH, LITHIUM   No results found for: PHENYTOIN, PHENOBARB, VALPROATE, CBMZ   .res Assessment: Plan:   Will continue current plan of care. Pt attributes current insomnia to acute stressors and anticipates sleep improving when acute stressors improve.  Continue Latuda 60 mg daily with a meal. Continue Lyrica 200 mg po TID for mood and anxiety.  Continue Buspar 15 mg po BID for anxiety.  Continue Hydroxyzine 25 mg po QHS for insomnia and anxiety.  Continue Wellbutrin XL 300 mg po qd for depression. Continue Ambien 10 mg po QHS for insomnia.  Pt to follow-up in 2 months or sooner if clinically indicated.  Patient advised to contact office with any questions, adverse effects, or acute worsening in signs and symptoms.  Shekia was seen today for insomnia, anxiety and other.  Diagnoses and all orders for this visit:  Bipolar affective disorder, remission status unspecified (HCC) -     buPROPion (WELLBUTRIN XL) 300 MG 24 hr tablet; Take 1 tablet (300 mg total) by mouth every morning. -  Lurasidone HCl (LATUDA) 60 MG TABS; Take 1 tablet (60 mg total) by mouth daily with supper. -      pregabalin (LYRICA) 200 MG capsule; Take 1 capsule (200 mg total) by mouth 3 (three) times daily.  PTSD (post-traumatic stress disorder) -     busPIRone (BUSPAR) 15 MG tablet; Take 1 tablet (15 mg total) by mouth 2 (two) times daily. -     pregabalin (LYRICA) 200 MG capsule; Take 1 capsule (200 mg total) by mouth 3 (three) times daily.  Insomnia, unspecified type -     hydrOXYzine (ATARAX/VISTARIL) 25 MG tablet; Take 1 tablet (25 mg total) by mouth at bedtime. -     zolpidem (AMBIEN) 10 MG tablet; Take 1 tablet (10 mg total) by mouth at bedtime.  GAD (generalized anxiety disorder) -     hydrOXYzine (ATARAX/VISTARIL) 25 MG tablet; Take 1 tablet (25 mg total) by mouth at bedtime.     Please see After Visit Summary for patient specific instructions.  Future Appointments  Date Time Provider Department Center  10/30/2020  1:00 PM Corie Chiquito, PMHNP [facility] None    No orders of the defined types were placed in this encounter.   -------------------------------

## 2020-10-22 ENCOUNTER — Other Ambulatory Visit: Payer: Self-pay | Admitting: Psychiatry

## 2020-10-22 DIAGNOSIS — F319 Bipolar disorder, unspecified: Secondary | ICD-10-CM

## 2020-10-22 DIAGNOSIS — G4700 Insomnia, unspecified: Secondary | ICD-10-CM

## 2020-10-23 NOTE — Telephone Encounter (Signed)
Next apt 10/30/20 °

## 2020-10-30 ENCOUNTER — Other Ambulatory Visit: Payer: Self-pay

## 2020-10-30 ENCOUNTER — Encounter: Payer: Self-pay | Admitting: Psychiatry

## 2020-10-30 ENCOUNTER — Ambulatory Visit (INDEPENDENT_AMBULATORY_CARE_PROVIDER_SITE_OTHER): Payer: Medicare Other | Admitting: Psychiatry

## 2020-10-30 DIAGNOSIS — F319 Bipolar disorder, unspecified: Secondary | ICD-10-CM

## 2020-10-30 DIAGNOSIS — F4310 Post-traumatic stress disorder, unspecified: Secondary | ICD-10-CM

## 2020-10-30 DIAGNOSIS — G4700 Insomnia, unspecified: Secondary | ICD-10-CM | POA: Diagnosis not present

## 2020-10-30 MED ORDER — BUSPIRONE HCL 15 MG PO TABS: 15.0000 mg | ORAL_TABLET | Freq: Two times a day (BID) | ORAL | 0 refills | Status: DC

## 2020-10-30 MED ORDER — BUPROPION HCL ER (XL) 300 MG PO TB24: ORAL_TABLET | ORAL | 0 refills | Status: DC

## 2020-10-30 MED ORDER — LATUDA 60 MG PO TABS: 60.0000 mg | ORAL_TABLET | Freq: Every day | ORAL | 2 refills | Status: DC

## 2020-10-30 MED ORDER — ZOLPIDEM TARTRATE 10 MG PO TABS: 10.0000 mg | ORAL_TABLET | Freq: Every day | ORAL | 2 refills | Status: DC

## 2020-10-30 NOTE — Progress Notes (Signed)
   10/30/20 1352  Facial and Oral Movements  Muscles of Facial Expression 0  Lips and Perioral Area 0  Jaw 0  Tongue 0  Extremity Movements  Upper (arms, wrists, hands, fingers) 0  Lower (legs, knees, ankles, toes) 0  Trunk Movements  Neck, shoulders, hips 0  Overall Severity  Severity of abnormal movements (highest score from questions above) 0  Incapacitation due to abnormal movements 1 (Reports tremors/muscle spasms since fusion.)  Patient's awareness of abnormal movements (rate only patient's report) 2  AIMS Total Score  AIMS Total Score 3

## 2020-10-30 NOTE — Progress Notes (Signed)
Julie Abbott [id number] Mar 07, 1976 44 y.o.  Subjective:   Patient ID:  Julie Abbott is a 44 y.o. (DOB 05-21-1976) female.  Chief Complaint:  Chief Complaint  Patient presents with  . Follow-up    Anxiety, mood disturbance    HPI Julie Abbott presents to the office today for follow-up of mood disturbance, anxiety, and insomnia. She reports that there is an upcoming court date re: custody of her granddaughter next week. She reports that "there's nothing I can do about it... I think I am pretty much numb." She reports that she has sadness in response to situation with granddaughter. She describes herself as "even keel." She reports some irritability with boyfriend and recognizes that she is angry and has difficulty expressing it. She reports that she is "not optimistic about things." She reports that she has anxiety with leaving the house. Occ flashbacks. She reports that she will use grounding exercises to help with anxiety and remind herself that she is in a different situation now. Denies any panic s/s. Estimates sleeping 4-5 hours a day. She reports that Ambien helps with sleep initiation. She reports poor concentration and ST memory issues. She reports that she has word finding errors. She reports increased anxiety when she has difficulty with memory, ie such as forgetting where she was driving to. Low energy and reports that she feels tired upon awakening. Denies any manic s/s. Appetite has been ok and reports that she has been gaining weight and describes herself as an "emotional eater."  Reports sleepiness with Latuda and that she has some affective dulling.   Reports strained relationship with mother and reports that she has been limiting contact. Boyfriend has been supportive.   Past Psychiatric Medication Trials: Ritalin- Prescribed when she was 44 yo. Adderall- was taking before pain medication. Would have "crash" in the afternoon. Latuda- Was taking in [place] at 80 mg BID. Reports  that Latuda seemed to decrease frequency of manic s/s. Depression was less. Denies side effects Vraylar- Ineffective Abilify Risperdal Geodon Seroquel- Increased activation. Lamictal- Tolerated ok. Had some arthralgias. Depakote Trileptal Wellbutrin- Has taken long-term. Was increased to 300 mg po qd in the last 1-2 years. Denies side effects. Helps with decreasing smoking. Citalopram Cymbalta Buspar- Started by PCP in [place] after major stressor before moving to [place]. Thinks it is somewhat helpful for anxiety and rumination. Denies side effective.  Ambien- Most effective for sleep. Causes some impulsivity. Has taken for about 10 years Lunesta- ineffective. Melatonin Hydroxyzine Lyrica- Prescribed for pain Gabapentin-Adverse reaction  AIMS     Office Visit from 10/30/2020 in [facility] Office Visit from 04/10/2020 in [facility]  AIMS Total Score 3 3       Review of Systems:  Review of Systems  Endocrine:       Reports good glycemic control  Musculoskeletal: Positive for arthralgias and gait problem.       Reports that she is getting a ramp to get into her house  Neurological: Positive for tremors.  Psychiatric/Behavioral:       Please refer to HPI    Medications: I have reviewed the patient's current medications.  Current Outpatient Medications  Medication Sig Dispense Refill  . buPROPion (WELLBUTRIN XL) 300 MG 24 hr tablet TAKE 1 TABLET BY MOUTH ONCE DAILY IN THE MORNING 90 tablet 0  . busPIRone (BUSPAR) 15 MG tablet Take 1 tablet (15 mg total) by mouth 2 (two) times daily. 180 tablet 0  . cyclobenzaprine (FLEXERIL) 10 MG tablet 2 (  two) times a day as needed.    [name]. ELMIRON 100 MG capsule TAKE 1 CAPSULE BY MOUTH 30 MINUTES BEFORE MEALS    . estradiol (CLIMARA) 0.06 MG/24HR 1 patch once a week.    . hydrOXYzine (ATARAX/VISTARIL) 25 MG tablet Take 1 tablet (25 mg total) by mouth at bedtime. 90 tablet 1  . LANTUS SOLOSTAR 100 UNIT/ML Solostar  Pen SMARTSIG:60 Unit(s) SUB-Q Every Night    . LINZESS 72 MCG capsule Take 72 mcg by mouth daily.    [name]. losartan (COZAAR) 25 MG tablet Take by mouth.    . Lurasidone HCl (LATUDA) 60 MG TABS Take 1 tablet (60 mg total) by mouth daily with supper. 30 tablet 2  . oxyCODONE (OXY IR/ROXICODONE) 5 MG immediate release tablet SMARTSIG:1 Tablet(s) By Mouth 6 Times Daily PRN    . OZEMPIC, 1 MG/DOSE, 4 MG/3ML SOPN INJECT 1 MG ONCE A WEEK SAME DAY EACH WEEK FOR DIABETES    . pregabalin (LYRICA) 200 MG capsule Take 1 capsule (200 mg total) by mouth 3 (three) times daily. 90 capsule 2  . rosuvastatin (CRESTOR) 10 MG tablet Take by mouth.    . tamsulosin (FLOMAX) 0.4 MG CAPS capsule Take by mouth.    [name]. [START ON 11/20/2020] zolpidem (AMBIEN) 10 MG tablet Take 1 tablet (10 mg total) by mouth at bedtime. 30 tablet 2   No current facility-administered medications for this visit.    Medication Side Effects: Sedation  Allergies:  Allergies  Allergen Reactions  . Bee Venom   . Gabapentin   . Lortab [Hydrocodone-Acetaminophen]     Past Medical History:  Diagnosis Date  . Back pain   . Diabetes mellitus, type II (HCC)   . Interstitial cystitis   . PCOS (polycystic ovarian syndrome)     Family History  Problem Relation Age of Onset  . Bipolar disorder Daughter   . Anxiety disorder Daughter   . Depression Mother   . Alcohol abuse Mother   . Drug abuse Mother   . OCD Mother   . Panic disorder Mother   . Alcohol abuse Maternal Aunt   . Polycystic ovary syndrome Paternal Aunt   . Alcohol abuse Maternal Uncle   . Alcohol abuse Maternal Grandmother   . Anxiety disorder Maternal Grandmother   . Depression Maternal Grandmother   . Polycystic ovary syndrome Paternal Grandmother     Social History   Socioeconomic History  . Marital status: Unknown    Spouse name: Not on file  . Number of children: Not on file  . Years of education: Not on file  . Highest education level: Not on file   Occupational History  . Not on file  Tobacco Use  . Smoking status: Current Every Day Smoker    Packs/day: 0.25    Types: Cigarettes  . Smokeless tobacco: Never Used  Substance and Sexual Activity  . Alcohol use: Never  . Drug use: Never  . Sexual activity: Not on file  Other Topics Concern  . Not on file  Social History Narrative  . Not on file   Social Determinants of Health   Financial Resource Strain:   . Difficulty of Paying Living Expenses: Not on file  Food Insecurity:   . Worried About Programme researcher, broadcasting/film/videounning Out of Food in the Last Year: Not on file  . Ran Out of Food in the Last Year: Not on file  Transportation Needs:   . Lack of Transportation (Medical): Not on file  . Lack of Transportation (  Non-Medical): Not on file  Physical Activity:   . Days of Exercise per Week: Not on file  . Minutes of Exercise per Session: Not on file  Stress:   . Feeling of Stress : Not on file  Social Connections:   . Frequency of Communication with Friends and Family: Not on file  . Frequency of Social Gatherings with Friends and Family: Not on file  . Attends Religious Services: Not on file  . Active Member of Clubs or Organizations: Not on file  . Attends Banker Meetings: Not on file  . Marital Status: Not on file  Intimate Partner Violence:   . Fear of Current or Ex-Partner: Not on file  . Emotionally Abused: Not on file  . Physically Abused: Not on file  . Sexually Abused: Not on file    Past Medical History, Surgical history, Social history, and Family history were reviewed and updated as appropriate.   Please see review of systems for further details on the patient's review from today.   Objective:   Physical Exam:  BP 118/85   Pulse 86   Physical Exam Constitutional:      General: She is not in acute distress. Musculoskeletal:        General: No deformity.  Neurological:     Mental Status: She is alert and oriented to person, place, and time.      Coordination: Coordination normal.  Psychiatric:        Attention and Perception: Attention and perception normal. She does not perceive auditory or visual hallucinations.        Mood and Affect: Mood is anxious and depressed. Affect is not labile, blunt, angry or inappropriate.        Speech: Speech normal.        Behavior: Behavior normal.        Thought Content: Thought content normal. Thought content is not paranoid or delusional. Thought content does not include homicidal or suicidal ideation. Thought content does not include homicidal or suicidal plan.        Cognition and Memory: Cognition and memory normal.        Judgment: Judgment normal.     Comments: Insight intact     Lab Review:  No results found for: NA, K, CL, CO2, GLUCOSE, BUN, CREATININE, CALCIUM, PROT, ALBUMIN, AST, ALT, ALKPHOS, BILITOT, GFRNONAA, GFRAA  No results found for: WBC, RBC, HGB, HCT, PLT, MCV, MCH, MCHC, RDW, LYMPHSABS, MONOABS, EOSABS, BASOSABS  No results found for: POCLITH, LITHIUM   No results found for: PHENYTOIN, PHENOBARB, VALPROATE, CBMZ   .res Assessment: Plan:   Will continue current plan of care at this time. Discussed considering possible med change after the holidays to reduce affective dulling. She reports that she would prefer to continue current medications at this time.  Continue Latuda 60 mg daily with evening meal for mood s/s. Continue Wellbutrin XL 300 mg po qd for depression. Continue Buspar 15 mg po BID for anxiety.  Continue Hydroxyzine 25 mg po QHS for insomnia.  Continue Lyrica 200 mg po TID for mood and anxiety.  Continue Ambien 10 mg po QHS for insomnia.  Pt to follow-up in 2 months or sooner if clinically indicated.  Patient advised to contact office with any questions, adverse effects, or acute worsening in signs and symptoms.  Patton was seen today for follow-up.  Diagnoses and all orders for this visit:  Bipolar affective disorder, remission status unspecified  (HCC) -     buPROPion (  WELLBUTRIN XL) 300 MG 24 hr tablet; TAKE 1 TABLET BY MOUTH ONCE DAILY IN THE MORNING -     Lurasidone HCl (LATUDA) 60 MG TABS; Take 1 tablet (60 mg total) by mouth daily with supper.  PTSD (post-traumatic stress disorder) -     busPIRone (BUSPAR) 15 MG tablet; Take 1 tablet (15 mg total) by mouth 2 (two) times daily.  Insomnia, unspecified type -     zolpidem (AMBIEN) 10 MG tablet; Take 1 tablet (10 mg total) by mouth at bedtime.     Please see After Visit Summary for patient specific instructions.  No future appointments.  No orders of the defined types were placed in this encounter.   -------------------------------

## 2020-12-18 ENCOUNTER — Other Ambulatory Visit: Payer: Self-pay | Admitting: Psychiatry

## 2020-12-18 DIAGNOSIS — F319 Bipolar disorder, unspecified: Secondary | ICD-10-CM

## 2020-12-18 DIAGNOSIS — F4310 Post-traumatic stress disorder, unspecified: Secondary | ICD-10-CM

## 2020-12-25 ENCOUNTER — Ambulatory Visit: Payer: Medicare Other | Admitting: Psychiatry

## 2021-01-16 ENCOUNTER — Telehealth: Payer: Self-pay | Admitting: Psychiatry

## 2021-01-16 DIAGNOSIS — F319 Bipolar disorder, unspecified: Secondary | ICD-10-CM

## 2021-01-16 DIAGNOSIS — F4310 Post-traumatic stress disorder, unspecified: Secondary | ICD-10-CM

## 2021-01-16 MED ORDER — PREGABALIN 200 MG PO CAPS: 200.0000 mg | ORAL_CAPSULE | Freq: Three times a day (TID) | ORAL | 0 refills | Status: DC

## 2021-01-16 MED ORDER — BUSPIRONE HCL 15 MG PO TABS: 15.0000 mg | ORAL_TABLET | Freq: Two times a day (BID) | ORAL | 0 refills | Status: DC

## 2021-01-16 NOTE — Telephone Encounter (Signed)
Pt called requesting refills for Bupropion and

## 2021-01-16 NOTE — Telephone Encounter (Signed)
Pt called and said that she needs a refill on her buspar and her lyrica to the [facility] [place] . Her next appt is 02/07/21

## 2021-01-16 NOTE — Addendum Note (Signed)
Addended by: Derenda Mis on: 01/16/2021 12:26 PM   Modules accepted: Orders

## 2021-01-16 NOTE — Telephone Encounter (Signed)
Scripts sent

## 2021-02-05 ENCOUNTER — Other Ambulatory Visit: Payer: Self-pay

## 2021-02-05 ENCOUNTER — Ambulatory Visit
Admission: RE | Admit: 2021-02-05 | Discharge: 2021-02-05 | Disposition: A | Discharge status: Home / Self Care | Payer: Medicare Other | Source: Ambulatory Visit | Attending: Pain Medicine | Admitting: Pain Medicine

## 2021-02-05 ENCOUNTER — Other Ambulatory Visit: Payer: Self-pay | Admitting: Pain Medicine

## 2021-02-05 DIAGNOSIS — M542 Cervicalgia: Secondary | ICD-10-CM

## 2021-02-05 DIAGNOSIS — M4712 Other spondylosis with myelopathy, cervical region: Secondary | ICD-10-CM

## 2021-02-05 DIAGNOSIS — M545 Low back pain, unspecified: Secondary | ICD-10-CM

## 2021-02-05 IMAGING — CR DG CERVICAL SPINE 2 OR 3 VIEWS
3 series · 3 of 3 positions shown · non-contrast
Comparison: No prior.

CLINICAL DATA: Cervical spondylosis.  Myelopathy.

EXAM:
CERVICAL SPINE - 2-3 VIEW

[w cervical spine lat]
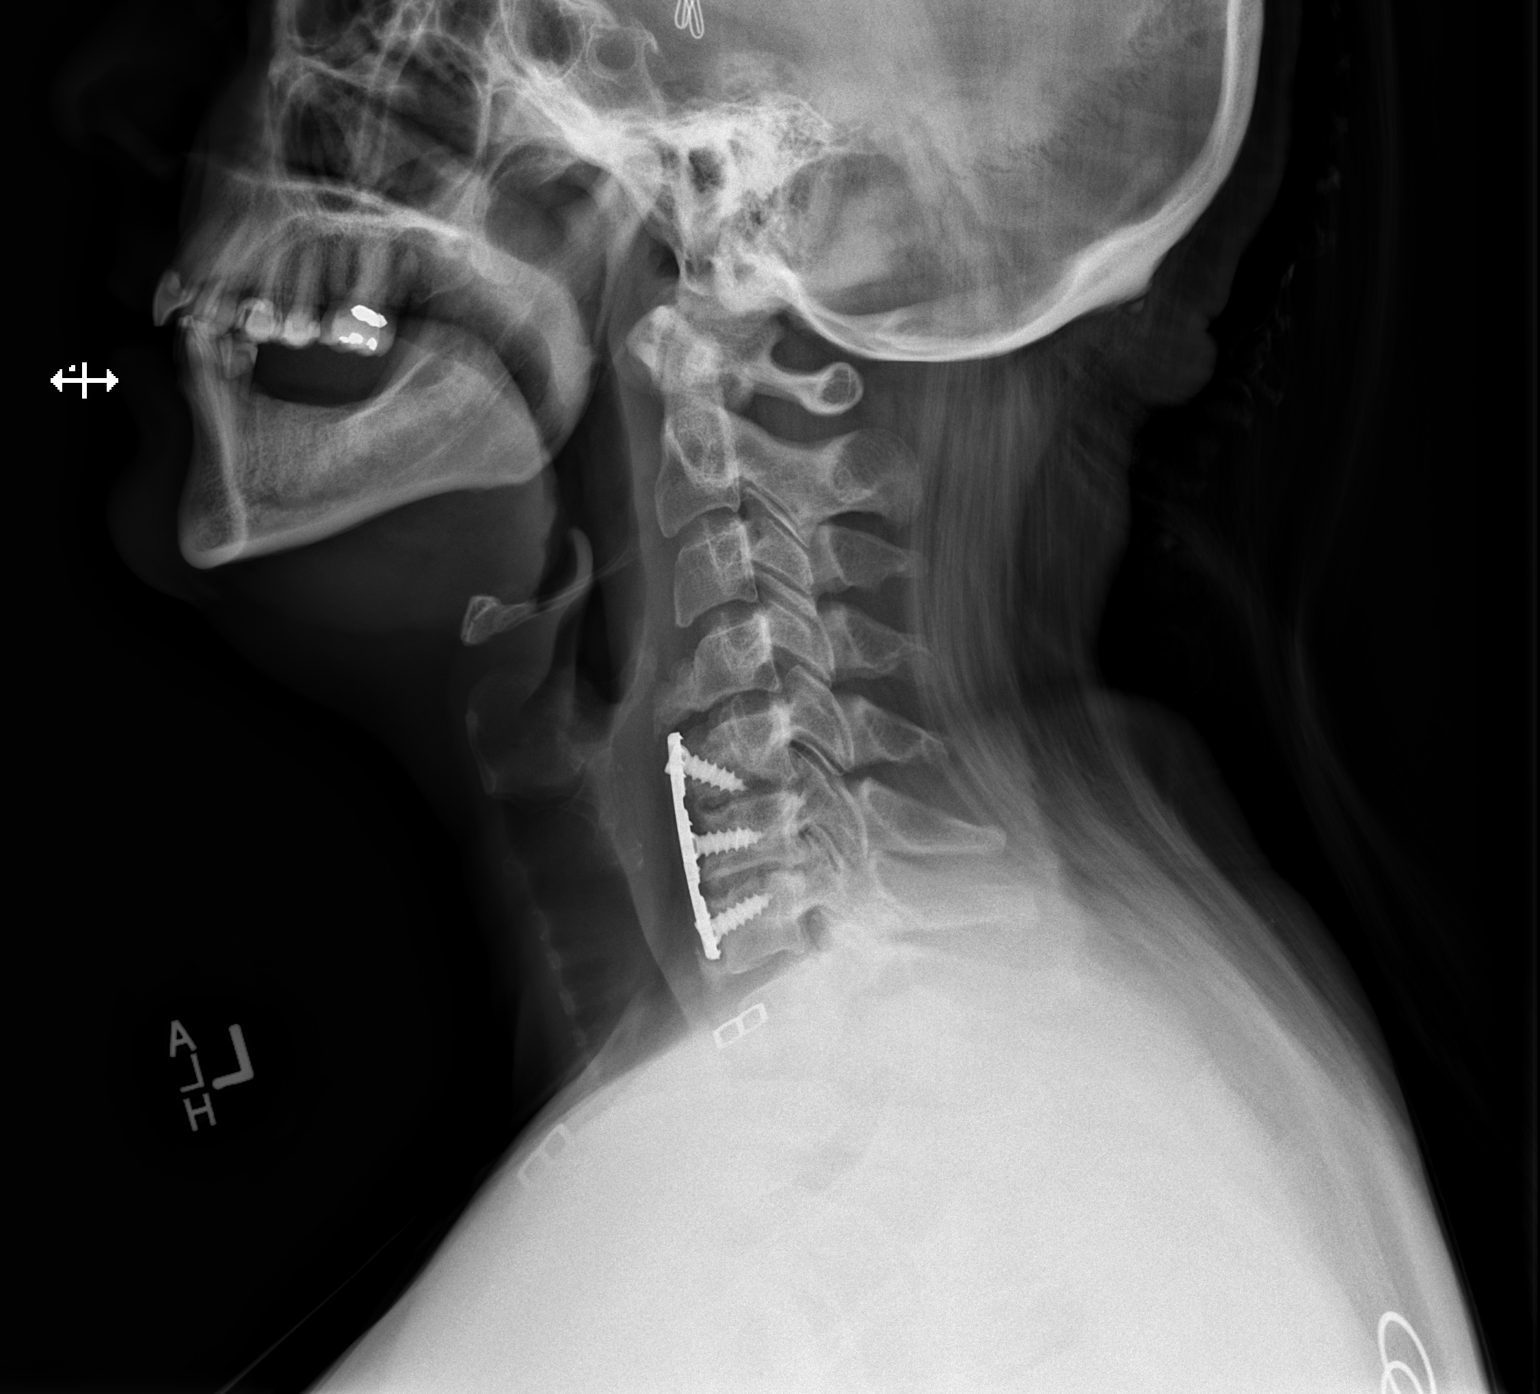

[w cervical spine ap]
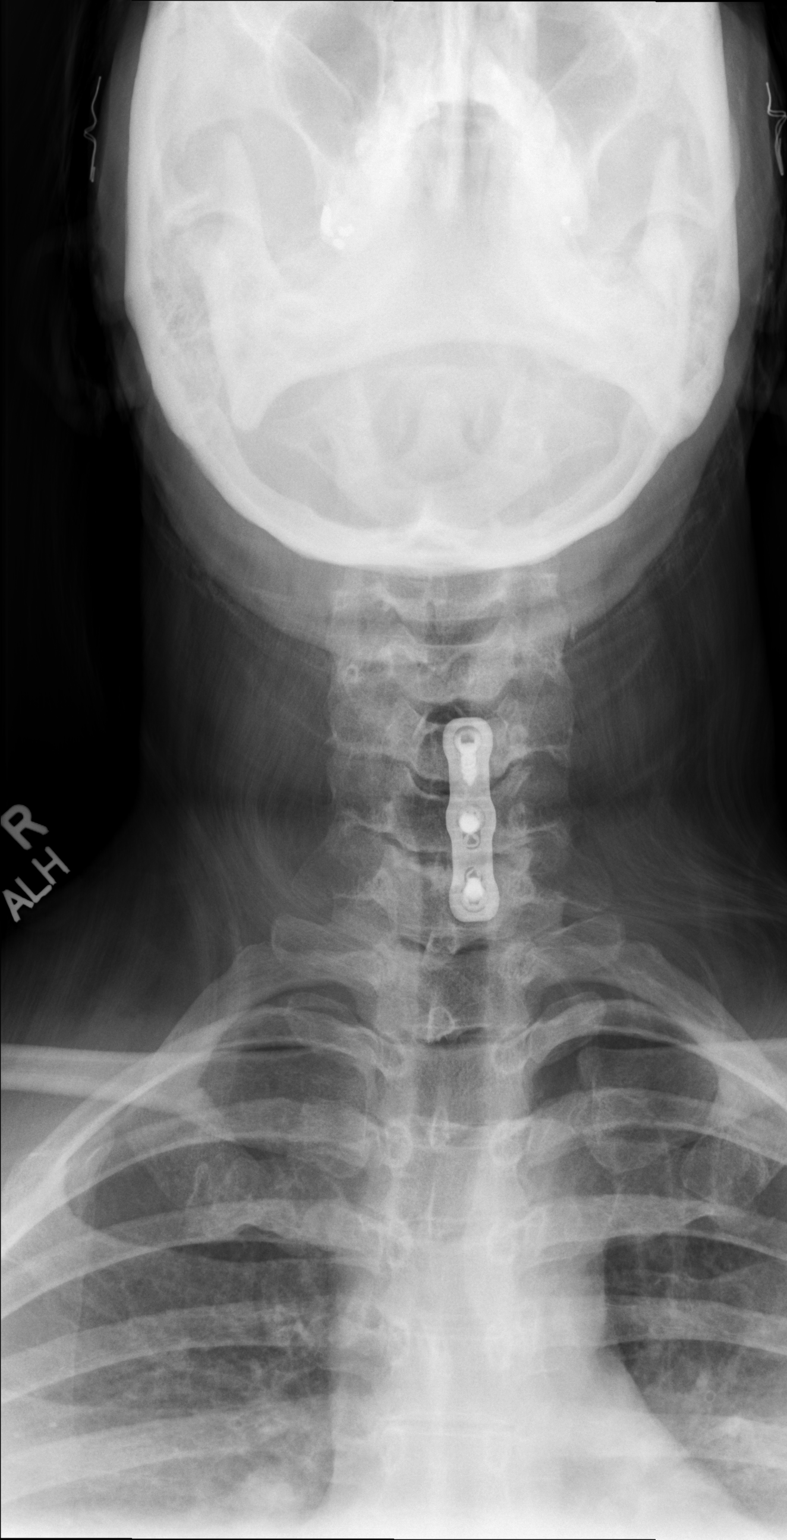

[w cervical spine odontoid]
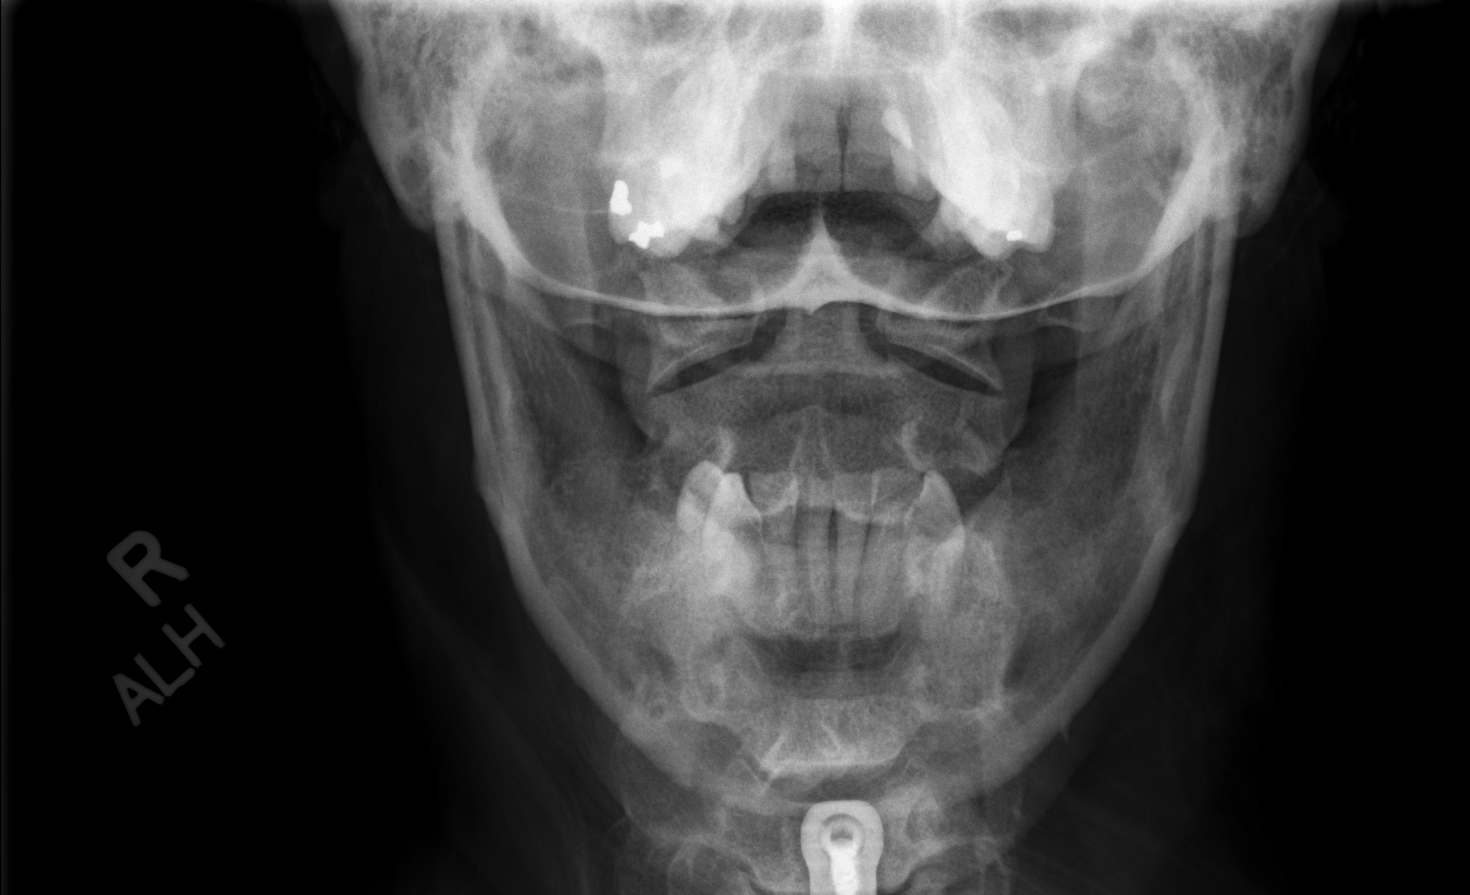

[3 of 3 positions shown; findings below may reference images not displayed]

FINDINGS: Mild straightening cervical spine. C5 through C7 anterior fusion.
Hardware intact. Anatomic alignment. Multilevel degenerative change
with prominent endplate osteophytes C4 through C7. No acute bony
abnormality identified.
IMPRESSION: 1. C5 through C7 anterior fusion. Hardware intact. Anatomic
alignment.
2. Multilevel degenerative change with prominent endplate
osteophytes C4 through C7.

## 2021-02-05 IMAGING — CR DG LUMBAR SPINE COMPLETE 4+V
5 series · 5 of 5 positions shown · non-contrast
Comparison: None.

CLINICAL DATA: Chronic low back and left leg pain without known
injury.

EXAM:
LUMBAR SPINE - COMPLETE 4+ VIEW

[w lumbar spine ap]
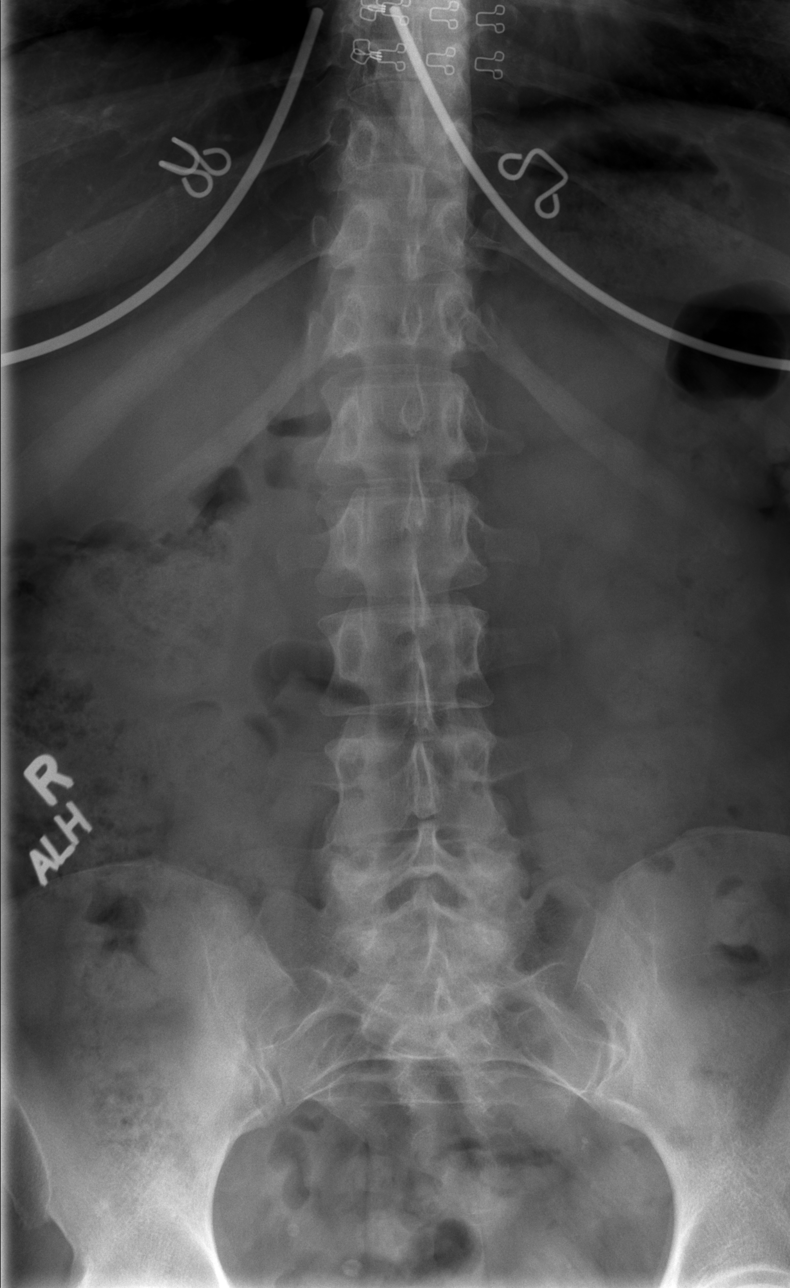

[t lumbar spine obl (1 of 2)]
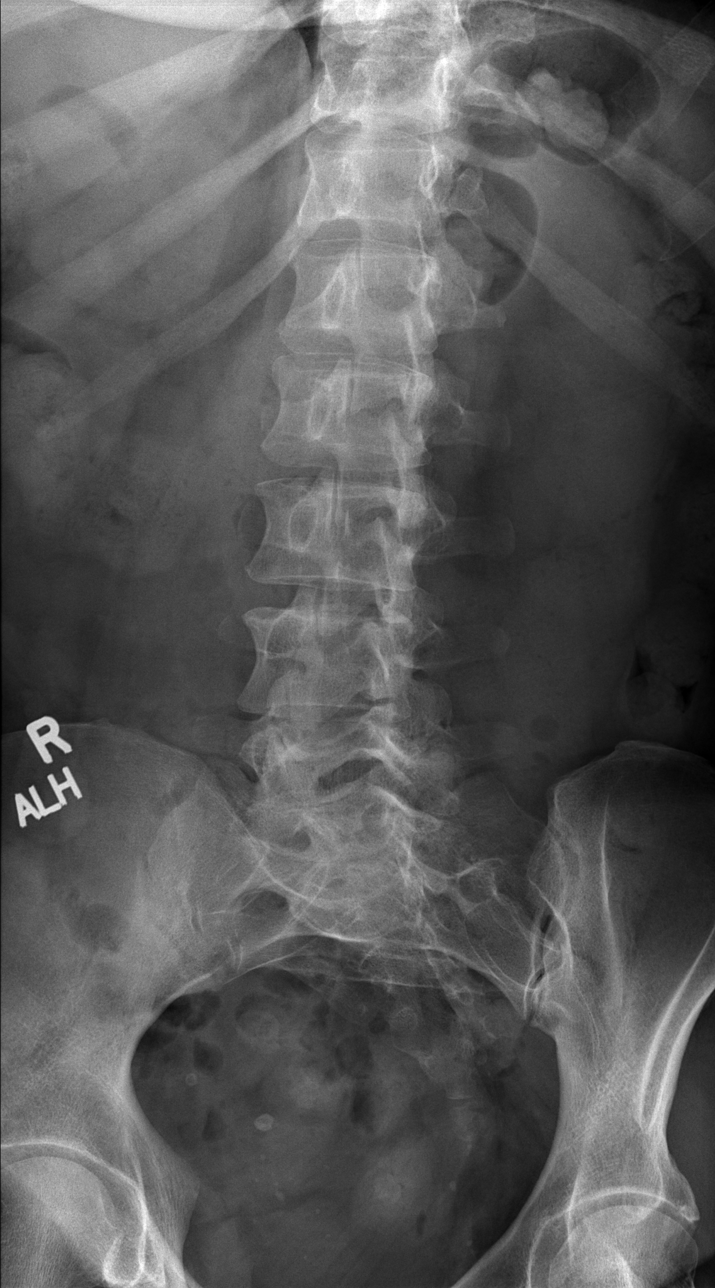

[t lumbar spine obl (2 of 2)]
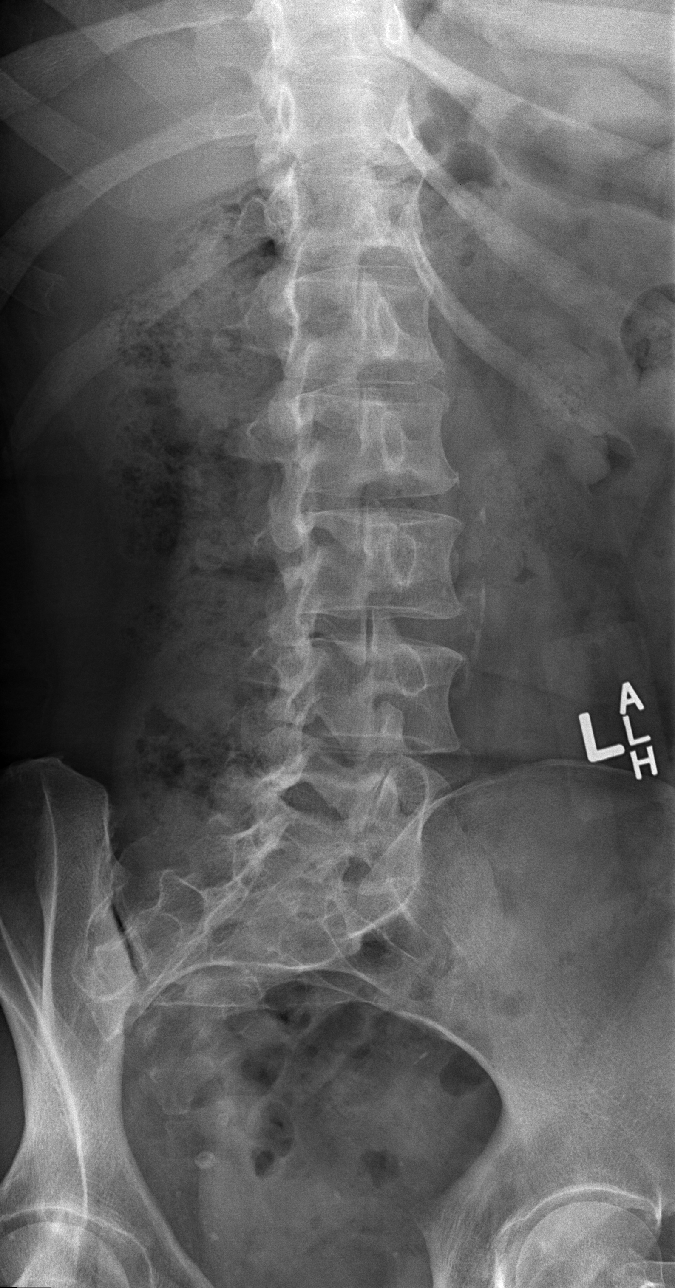

[t lumbar spine lat]
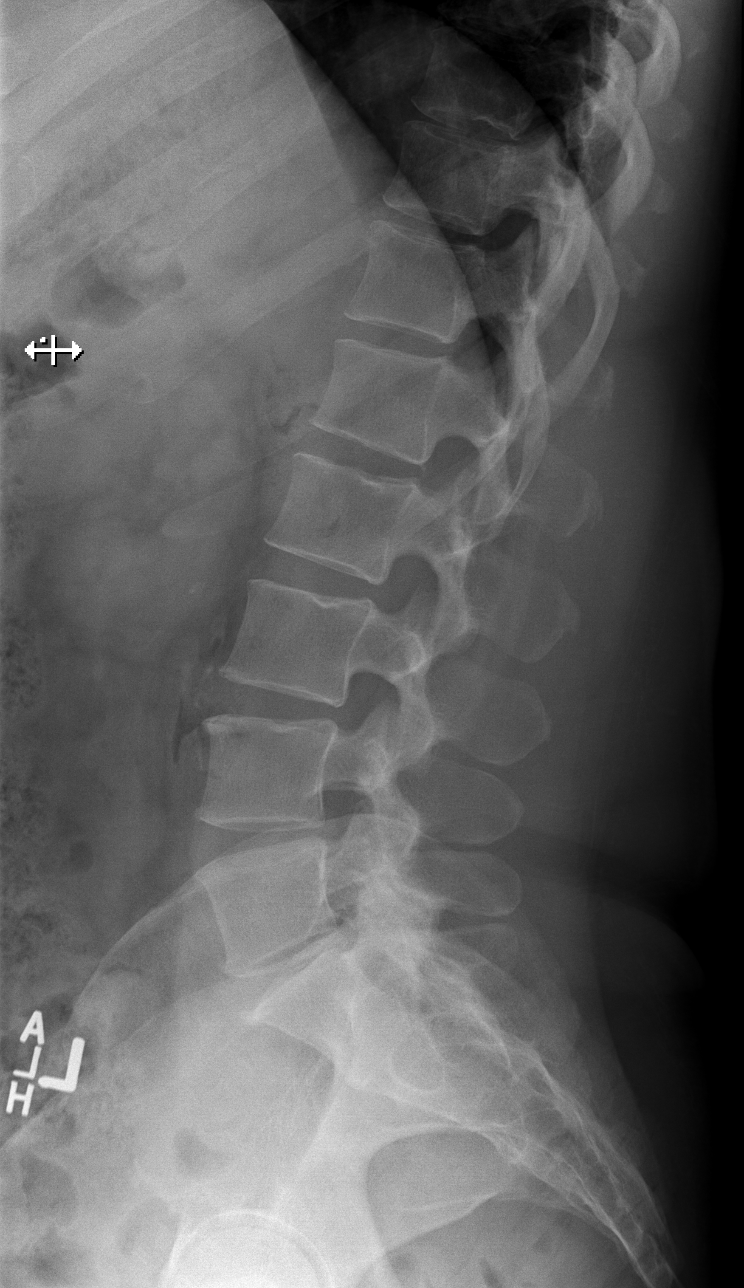

[t lumbar l-5 s-1 spot]
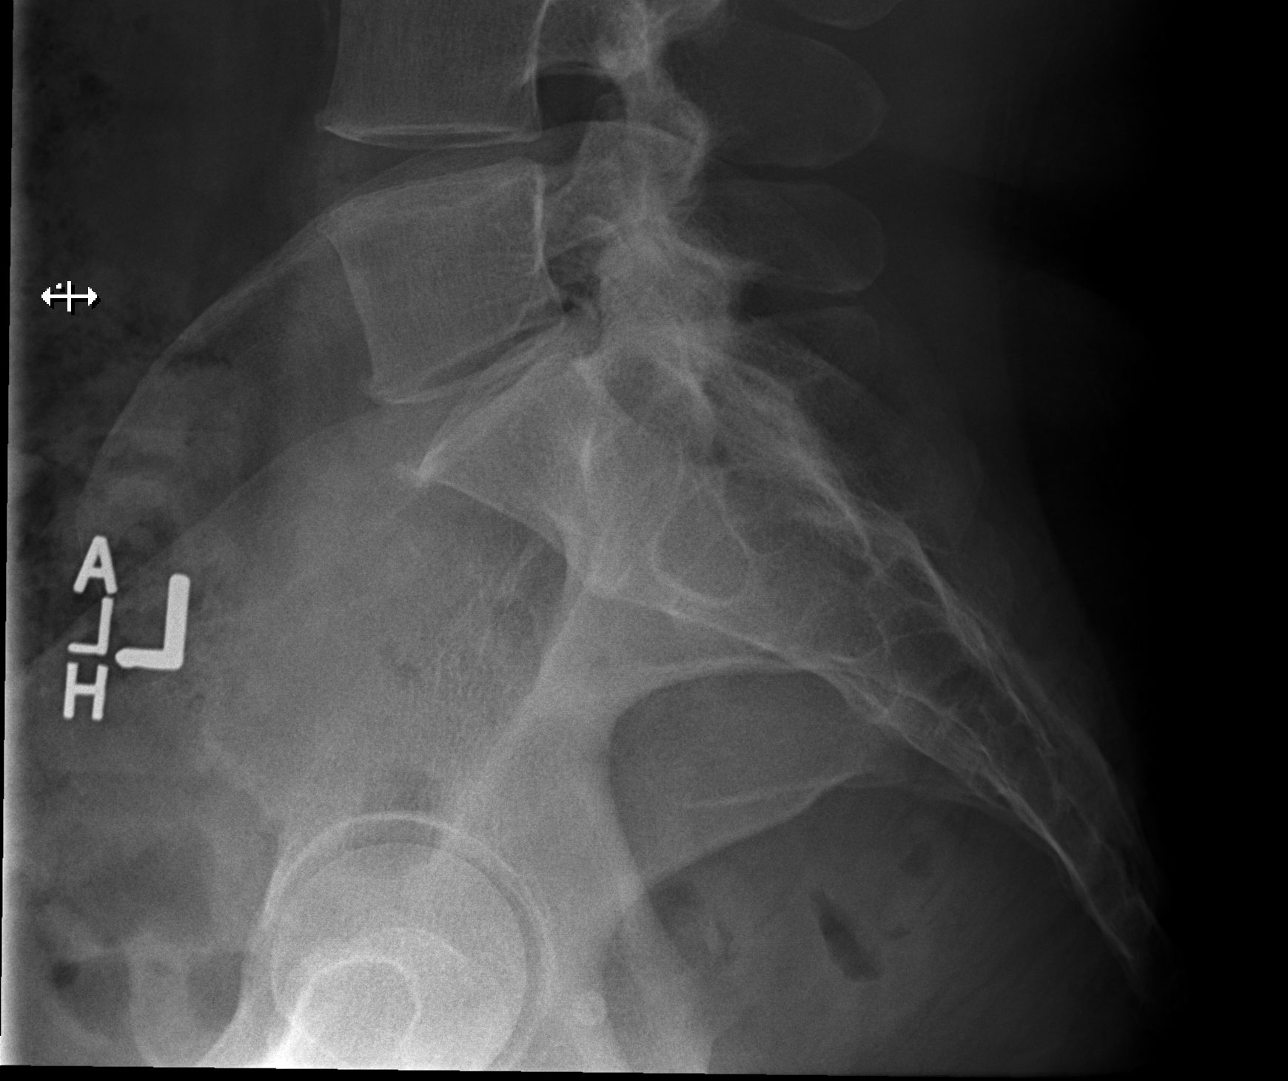

[5 of 5 positions shown; findings below may reference images not displayed]

FINDINGS: No fracture or spondylolisthesis is noted. Disc spaces are
well-maintained. Minimal anterior osteophyte formation is noted at
L3-4, L4-5 and L5-S1.
IMPRESSION: Minimal degenerative changes are noted. No acute abnormality seen in
the lumbar spine.

## 2021-02-07 ENCOUNTER — Ambulatory Visit (INDEPENDENT_AMBULATORY_CARE_PROVIDER_SITE_OTHER): Payer: Medicare Other | Admitting: Psychiatry

## 2021-02-07 ENCOUNTER — Other Ambulatory Visit: Payer: Self-pay

## 2021-02-07 ENCOUNTER — Encounter: Payer: Self-pay | Admitting: Psychiatry

## 2021-02-07 DIAGNOSIS — G4700 Insomnia, unspecified: Secondary | ICD-10-CM

## 2021-02-07 DIAGNOSIS — F319 Bipolar disorder, unspecified: Secondary | ICD-10-CM

## 2021-02-07 DIAGNOSIS — F411 Generalized anxiety disorder: Secondary | ICD-10-CM

## 2021-02-07 DIAGNOSIS — F4310 Post-traumatic stress disorder, unspecified: Secondary | ICD-10-CM | POA: Diagnosis not present

## 2021-02-07 MED ORDER — ZOLPIDEM TARTRATE 10 MG PO TABS: 10.0000 mg | ORAL_TABLET | Freq: Every day | ORAL | 2 refills | Status: DC

## 2021-02-07 MED ORDER — LATUDA 60 MG PO TABS: 60.0000 mg | ORAL_TABLET | Freq: Every day | ORAL | 5 refills | Status: DC

## 2021-02-07 MED ORDER — HYDROXYZINE HCL 25 MG PO TABS: 25.0000 mg | ORAL_TABLET | Freq: Every day | ORAL | 1 refills | Status: DC

## 2021-02-07 MED ORDER — PREGABALIN 200 MG PO CAPS: 200.0000 mg | ORAL_CAPSULE | Freq: Three times a day (TID) | ORAL | 2 refills | Status: DC

## 2021-02-07 MED ORDER — BUPROPION HCL ER (XL) 300 MG PO TB24: ORAL_TABLET | ORAL | 0 refills | Status: DC

## 2021-02-07 NOTE — Progress Notes (Signed)
Julie Abbott [id number] May 13, 1976 45 y.o.  Subjective:   Patient ID:  Julie Abbott is a 45 y.o. (DOB [birth year]) female.  Chief Complaint:  Chief Complaint  Patient presents with  . Anxiety  . Depression  . Sleeping Problem    HPI Julie Abbott presents to the office today for follow-up of anxiety, mood disturbance, and insomnia.  "My anxiety is always up. I am worrying about things I shouldn't even be worrying about." She reports, "my brain doesn't shut off." Denies any panic s/s. She reports that she has some anxiety in response to new husband's house having rooms that have been cluttered. She tries to keep the front room clean and orderly. She reports that her mood has been "ok." She reports difficulty sleeping despite taking sleep medication. Reports sleeping 3 hours last night, 2 hours the night before, and 6 hours the prior night. She reports that she started crying in church Sunday when her pastor mentioned children. She reports that she ruminates about granddaughter that she is unable to see. She reports that she tries not to go online to shop since this could lead to impulsive spending. Appetite varies drastically. Energy varies. Motivation is low and the day before an appointment she has to talk herself up to going and address anything that could be a barrier to going. Has a new base guitar but has not started learning how to play it yet. She reports poor concentration. Denies SI.   She reports that she has multiple family stressors. Pending possible court date for granddaughter's custody.   Married in October. New married name is Soil scientist. She reports that she has never been married. Was in a long-term relationship for 26 years in the past. They are living in his family house and she reports that she feels that nothing there is hers. Her mother was married 7 times. She reports that her husband "doesn't know a lot about my mental health."   Past Psychiatric Medication  Trials: Ritalin- Prescribed when she was 45 yo. Adderall- was taking before pain medication. Would have "crash" in the afternoon. Latuda- Was taking in [place] at 80 mg BID. Reports that Latuda seemed to decrease frequency of manic s/s. Depression was less. Denies side effects Vraylar- Ineffective Abilify Risperdal Geodon Seroquel- Increased activation. Lamictal- Tolerated ok. Had some arthralgias. Depakote Trileptal Wellbutrin- Has taken long-term. Was increased to 300 mg po qd in the last 1-2 years. Denies side effects. Helps with decreasing smoking. Citalopram Cymbalta Buspar- Started by PCP in [place] after major stressor before moving to [place]. Thinks it is somewhat helpful for anxiety and rumination. Denies side effective.  Ambien- Most effective for sleep. Causes some impulsivity. Has taken for about 10 years Lunesta- ineffective. Melatonin Hydroxyzine Lyrica- Prescribed for pain Gabapentin-Adverse reaction   AIMS   Flowsheet Row Office Visit from 10/30/2020 in [facility] Office Visit from 04/10/2020 in [facility]  AIMS Total Score 3 3       Review of Systems:  Review of Systems  Musculoskeletal: Positive for back pain and neck pain. Negative for gait problem.  Neurological: Positive for tremors.  Psychiatric/Behavioral:       Please refer to HPI    Has changed pain specialist.   Medications: I have reviewed the patient's current medications.  Current Outpatient Medications  Medication Sig Dispense Refill  . busPIRone (BUSPAR) 15 MG tablet Take 1 tablet (15 mg total) by mouth 2 (two) times daily. 180 tablet 0  . cyclobenzaprine (FLEXERIL) 10 MG  tablet 2 (two) times a day as needed.    [name] LINZESS 72 MCG capsule Take 72 mcg by mouth daily.    [name] losartan (COZAAR) 25 MG tablet Take by mouth.    . oxyCODONE (OXY IR/ROXICODONE) 5 MG immediate release tablet SMARTSIG:1 Tablet(s) By Mouth 6 Times Daily PRN    . OZEMPIC, 1 MG/DOSE, 4 MG/3ML SOPN  INJECT 1 MG ONCE A WEEK SAME DAY EACH WEEK FOR DIABETES    . rosuvastatin (CRESTOR) 10 MG tablet Take by mouth.    [name] buPROPion (WELLBUTRIN XL) 300 MG 24 hr tablet TAKE 1 TABLET BY MOUTH ONCE DAILY IN THE MORNING 90 tablet 0  . ELMIRON 100 MG capsule TAKE 1 CAPSULE BY MOUTH 30 MINUTES BEFORE MEALS (Patient not taking: Reported on 02/07/2021)    . estradiol (CLIMARA) 0.06 MG/24HR 1 patch once a week.    . hydrOXYzine (ATARAX/VISTARIL) 25 MG tablet Take 1 tablet (25 mg total) by mouth at bedtime. 90 tablet 1  . LANTUS SOLOSTAR 100 UNIT/ML Solostar Pen SMARTSIG:60 Unit(s) SUB-Q Every Night    . Lurasidone HCl (LATUDA) 60 MG TABS Take 1 tablet (60 mg total) by mouth daily with supper. 30 tablet 5  . [START ON 02/13/2021] pregabalin (LYRICA) 200 MG capsule Take 1 capsule (200 mg total) by mouth 3 (three) times daily. 90 capsule 2  . [START ON 03/04/2021] zolpidem (AMBIEN) 10 MG tablet Take 1 tablet (10 mg total) by mouth at bedtime. 30 tablet 2   No current facility-administered medications for this visit.    Medication Side Effects: None  Allergies:  Allergies  Allergen Reactions  . Bee Venom   . Gabapentin   . Lortab [Hydrocodone-Acetaminophen]     Past Medical History:  Diagnosis Date  . Back pain   . Diabetes mellitus, type II (HCC)   . Interstitial cystitis   . PCOS (polycystic ovarian syndrome)     Family History  Problem Relation Age of Onset  . Bipolar disorder Daughter   . Anxiety disorder Daughter   . Depression Mother   . Alcohol abuse Mother   . Drug abuse Mother   . OCD Mother   . Panic disorder Mother   . Alcohol abuse Maternal Aunt   . Polycystic ovary syndrome Paternal Aunt   . Alcohol abuse Maternal Uncle   . Alcohol abuse Maternal Grandmother   . Anxiety disorder Maternal Grandmother   . Depression Maternal Grandmother   . Polycystic ovary syndrome Paternal Grandmother     Social History   Socioeconomic History  . Marital status: Unknown    Spouse name:  Not on file  . Number of children: Not on file  . Years of education: Not on file  . Highest education level: Not on file  Occupational History  . Not on file  Tobacco Use  . Smoking status: Current Every Day Smoker    Packs/day: 0.25    Types: Cigarettes  . Smokeless tobacco: Never Used  Substance and Sexual Activity  . Alcohol use: Never  . Drug use: Never  . Sexual activity: Not on file  Other Topics Concern  . Not on file  Social History Narrative  . Not on file   Social Determinants of Health   Financial Resource Strain: Not on file  Food Insecurity: Not on file  Transportation Needs: Not on file  Physical Activity: Not on file  Stress: Not on file  Social Connections: Not on file  Intimate Partner Violence: Not on file  Past Medical History, Surgical history, Social history, and Family history were reviewed and updated as appropriate.   Please see review of systems for further details on the patient's review from today.   Objective:   Physical Exam:  There were no vitals taken for this visit.  Physical Exam Constitutional:      General: She is not in acute distress. Musculoskeletal:        General: No deformity.  Neurological:     Mental Status: She is alert and oriented to person, place, and time.     Coordination: Coordination normal.  Psychiatric:        Attention and Perception: Attention and perception normal. She does not perceive auditory or visual hallucinations.        Mood and Affect: Mood is anxious and depressed. Affect is not labile, blunt, angry or inappropriate.        Speech: Speech normal.        Behavior: Behavior normal.        Thought Content: Thought content normal. Thought content is not paranoid or delusional. Thought content does not include homicidal or suicidal ideation. Thought content does not include homicidal or suicidal plan.        Cognition and Memory: Cognition and memory normal.        Judgment: Judgment normal.      Comments: Insight intact     Lab Review:  No results found for: NA, K, CL, CO2, GLUCOSE, BUN, CREATININE, CALCIUM, PROT, ALBUMIN, AST, ALT, ALKPHOS, BILITOT, GFRNONAA, GFRAA  No results found for: WBC, RBC, HGB, HCT, PLT, MCV, MCH, MCHC, RDW, LYMPHSABS, MONOABS, EOSABS, BASOSABS  No results found for: POCLITH, LITHIUM   No results found for: PHENYTOIN, PHENOBARB, VALPROATE, CBMZ   .res Assessment: Plan:   She reports that she would like to continue current medications without changes since she is tolerating medication without difficulty and mood and anxiety s/s have been better controlled compared to times in the past.  Pt to follow-up in 2 months or sooner if clinically indicated.  Patient advised to contact office with any questions, adverse effects, or acute worsening in signs and symptoms.  Naylah was seen today for anxiety, depression and sleeping problem.  Diagnoses and all orders for this visit:  Bipolar affective disorder, remission status unspecified (HCC) -     buPROPion (WELLBUTRIN XL) 300 MG 24 hr tablet; TAKE 1 TABLET BY MOUTH ONCE DAILY IN THE MORNING -     Lurasidone HCl (LATUDA) 60 MG TABS; Take 1 tablet (60 mg total) by mouth daily with supper. -     pregabalin (LYRICA) 200 MG capsule; Take 1 capsule (200 mg total) by mouth 3 (three) times daily.  Insomnia, unspecified type -     hydrOXYzine (ATARAX/VISTARIL) 25 MG tablet; Take 1 tablet (25 mg total) by mouth at bedtime. -     zolpidem (AMBIEN) 10 MG tablet; Take 1 tablet (10 mg total) by mouth at bedtime.  GAD (generalized anxiety disorder) -     hydrOXYzine (ATARAX/VISTARIL) 25 MG tablet; Take 1 tablet (25 mg total) by mouth at bedtime.  PTSD (post-traumatic stress disorder) -     pregabalin (LYRICA) 200 MG capsule; Take 1 capsule (200 mg total) by mouth 3 (three) times daily.     Please see After Visit Summary for patient specific instructions.  Future Appointments  Date Time Provider Department Center   04/09/2021  9:30 AM Corie Chiquito, PMHNP [facility] None    No orders of the defined  types were placed in this encounter.   -------------------------------

## 2021-04-06 DIAGNOSIS — E1142 Type 2 diabetes mellitus with diabetic polyneuropathy: Secondary | ICD-10-CM

## 2021-04-06 DIAGNOSIS — K5902 Outlet dysfunction constipation: Secondary | ICD-10-CM | POA: Insufficient documentation

## 2021-04-06 HISTORY — DX: Type 2 diabetes mellitus with diabetic polyneuropathy: E11.42

## 2021-04-09 ENCOUNTER — Ambulatory Visit: Payer: Medicare Other | Admitting: Psychiatry

## 2021-04-20 ENCOUNTER — Other Ambulatory Visit: Payer: Self-pay | Admitting: Psychiatry

## 2021-04-20 DIAGNOSIS — F4310 Post-traumatic stress disorder, unspecified: Secondary | ICD-10-CM

## 2021-04-20 NOTE — Telephone Encounter (Signed)
Please review,I don't see on med list

## 2021-05-21 ENCOUNTER — Ambulatory Visit (INDEPENDENT_AMBULATORY_CARE_PROVIDER_SITE_OTHER): Payer: Medicare Other | Admitting: Psychiatry

## 2021-05-21 ENCOUNTER — Other Ambulatory Visit: Payer: Self-pay

## 2021-05-21 ENCOUNTER — Encounter: Payer: Self-pay | Admitting: Psychiatry

## 2021-05-21 DIAGNOSIS — F4310 Post-traumatic stress disorder, unspecified: Secondary | ICD-10-CM | POA: Diagnosis not present

## 2021-05-21 DIAGNOSIS — F319 Bipolar disorder, unspecified: Secondary | ICD-10-CM

## 2021-05-21 DIAGNOSIS — F411 Generalized anxiety disorder: Secondary | ICD-10-CM

## 2021-05-21 DIAGNOSIS — G4700 Insomnia, unspecified: Secondary | ICD-10-CM

## 2021-05-21 MED ORDER — BELSOMRA 20 MG PO TABS: 20.0000 mg | ORAL_TABLET | Freq: Every day | ORAL | 0 refills | Status: AC

## 2021-05-21 MED ORDER — ZOLPIDEM TARTRATE 10 MG PO TABS: ORAL_TABLET | ORAL | 2 refills | Status: DC

## 2021-05-21 MED ORDER — BUSPIRONE HCL 15 MG PO TABS: 15.0000 mg | ORAL_TABLET | Freq: Two times a day (BID) | ORAL | 0 refills | Status: DC

## 2021-05-21 MED ORDER — HYDROXYZINE HCL 25 MG PO TABS: 25.0000 mg | ORAL_TABLET | Freq: Every day | ORAL | 1 refills | Status: DC

## 2021-05-21 MED ORDER — PREGABALIN 200 MG PO CAPS: 200.0000 mg | ORAL_CAPSULE | Freq: Three times a day (TID) | ORAL | 2 refills | Status: DC

## 2021-05-21 MED ORDER — BUPROPION HCL ER (XL) 300 MG PO TB24
ORAL_TABLET | ORAL | 0 refills | Status: DC
Start: 2021-05-21 — End: 2021-07-31

## 2021-05-21 MED ORDER — BELSOMRA 15 MG PO TABS: 15.0000 mg | ORAL_TABLET | Freq: Every day | ORAL | 0 refills | Status: DC
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
## 2021-05-30 ENCOUNTER — Other Ambulatory Visit: Payer: Self-pay | Admitting: Pain Medicine

## 2021-05-30 ENCOUNTER — Other Ambulatory Visit: Payer: Self-pay | Admitting: Family Medicine

## 2021-05-30 DIAGNOSIS — M542 Cervicalgia: Secondary | ICD-10-CM

## 2021-05-30 DIAGNOSIS — M79603 Pain in arm, unspecified: Secondary | ICD-10-CM

## 2021-05-30 DIAGNOSIS — R200 Anesthesia of skin: Secondary | ICD-10-CM

## 2021-06-03 ENCOUNTER — Other Ambulatory Visit: Payer: Self-pay | Admitting: Psychiatry

## 2021-06-03 DIAGNOSIS — G4700 Insomnia, unspecified: Secondary | ICD-10-CM

## 2021-06-06 ENCOUNTER — Ambulatory Visit
Admission: RE | Admit: 2021-06-06 | Discharge: 2021-06-06 | Disposition: A | Discharge status: Home / Self Care | Payer: Medicare Other | Source: Ambulatory Visit | Attending: Pain Medicine | Admitting: Pain Medicine

## 2021-06-06 DIAGNOSIS — M79603 Pain in arm, unspecified: Secondary | ICD-10-CM

## 2021-06-06 DIAGNOSIS — R200 Anesthesia of skin: Secondary | ICD-10-CM

## 2021-06-06 DIAGNOSIS — M542 Cervicalgia: Secondary | ICD-10-CM

## 2021-06-06 IMAGING — MR MR CERVICAL SPINE W/O CM
4 of 5 series · 29 of 48 positions shown · non-contrast
Comparison: Plain film cervical spine [DATE].

CLINICAL DATA: Chronic neck pain and upper extremity weakness.
History of prior cervical surgery.

EXAM:
MRI CERVICAL SPINE WITHOUT CONTRAST
TECHNIQUE: Multiplanar, multisequence MR imaging of the cervical spine was
performed. No intravenous contrast was administered.

[Series 3: T2 · sagittal · 3.0mm · 0.66mm/px · 8 of 18 slices shown (1 of 2)]
[im 1/18]
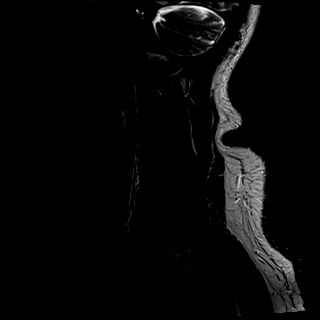
[im 3/18]
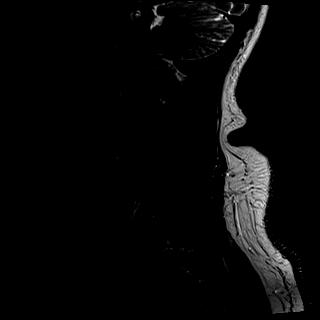
[im 5/18]
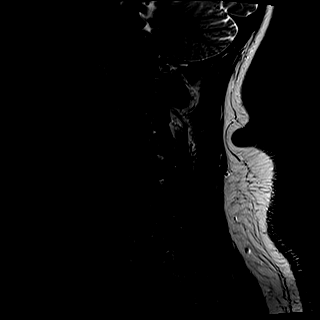
[im 8/18]
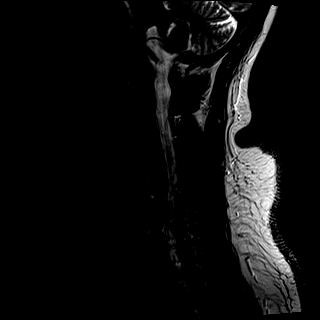
[im 10/18]
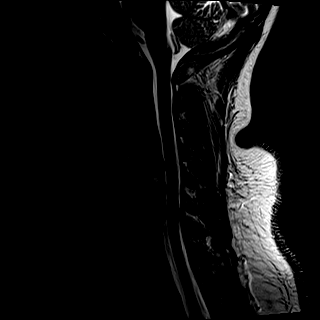
[im 13/18]
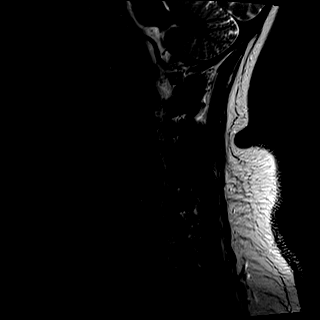
[im 15/18]
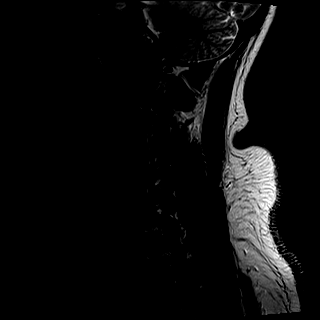
[im 18/18]
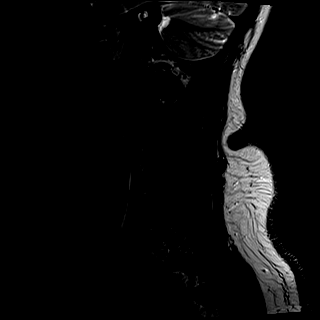

[Series 4: T1 · sagittal · 3.0mm · 0.41mm/px · 8 of 18 slices shown]
[im 1/18]
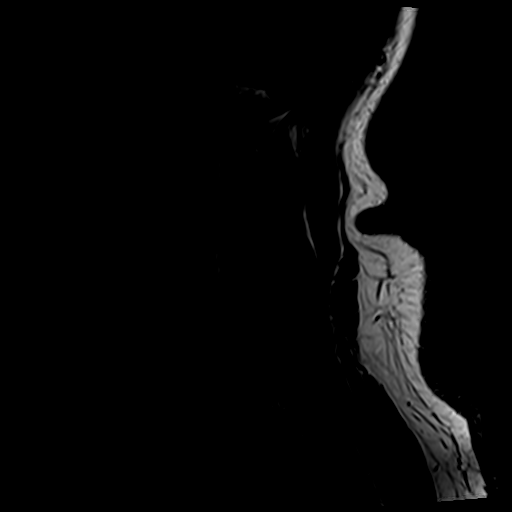
[im 3/18]
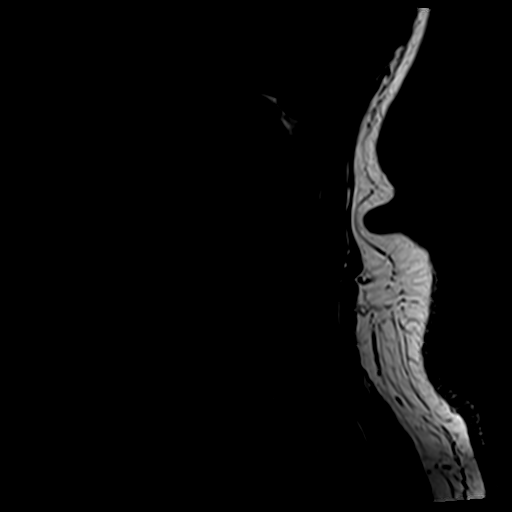
[im 5/18]
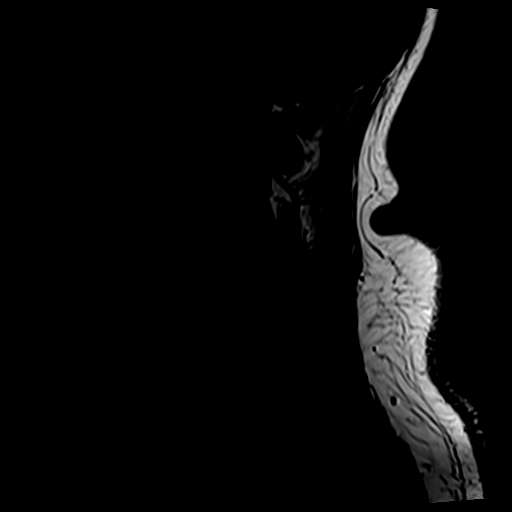
[im 8/18]
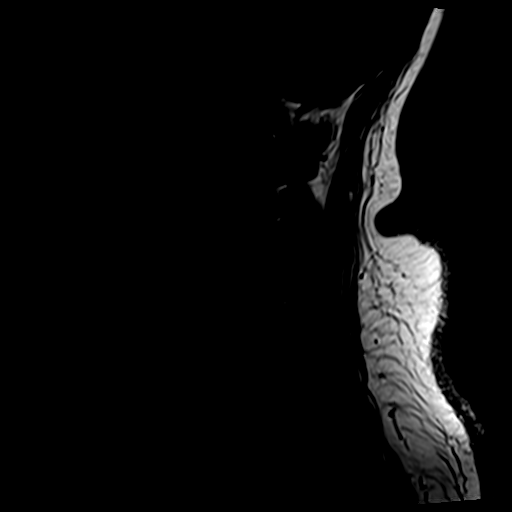
[im 10/18]
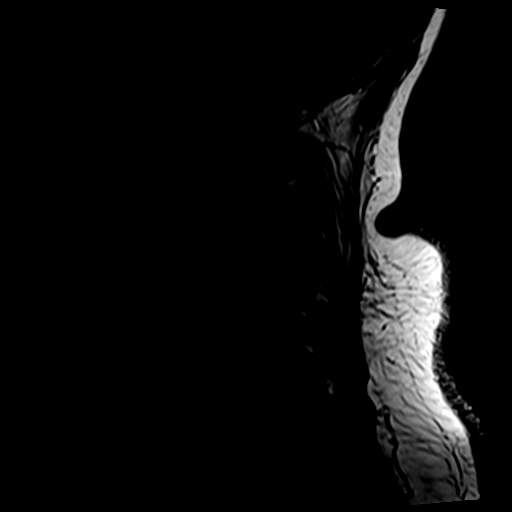
[im 13/18]
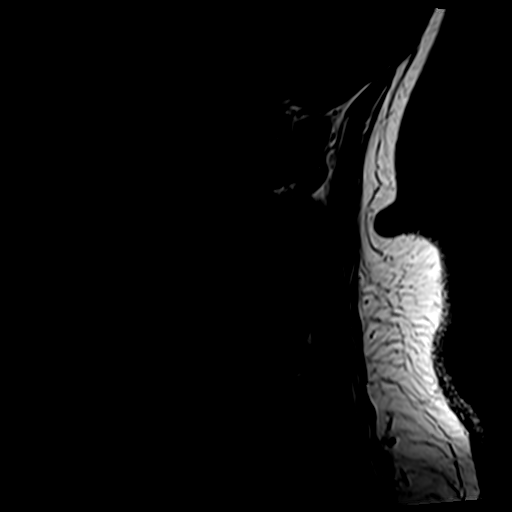
[im 15/18]
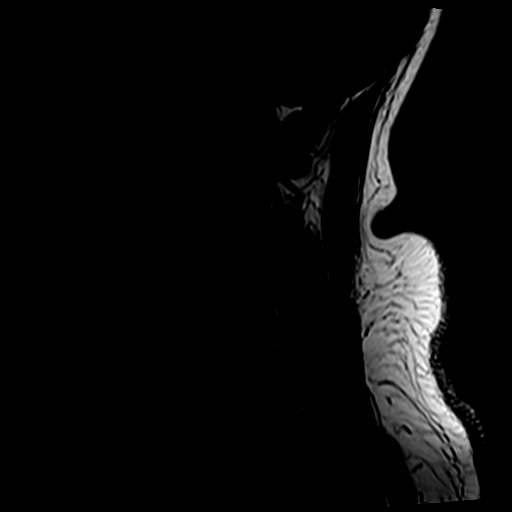
[im 18/18]
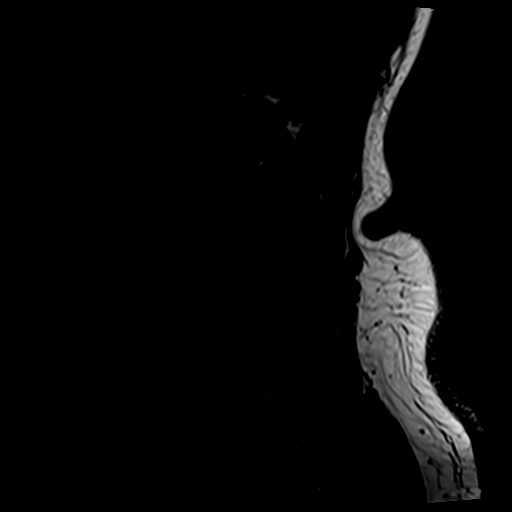

[Series 5: tir sag · sagittal · 3.0mm · 0.41mm/px · 4 of 18 slices shown]
[im 1/18]
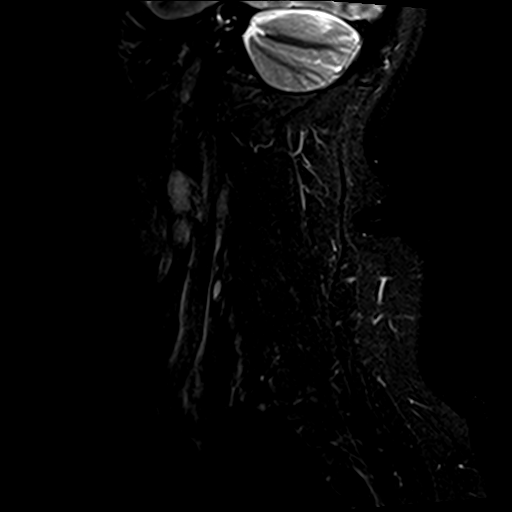
[im 3/18]
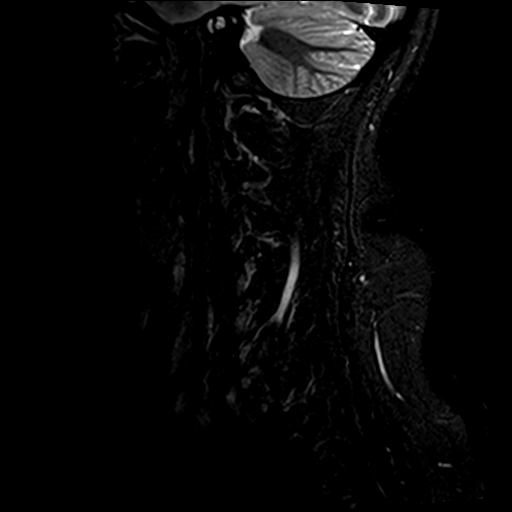
[im 10/18]
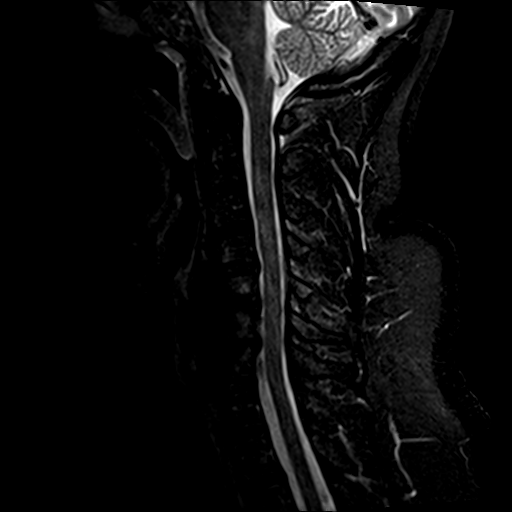
[im 15/18]
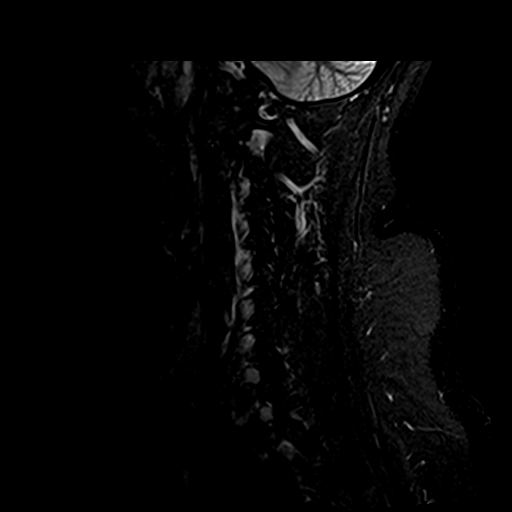

[Series 7: T2 · axial · 3.0mm · 0.70mm/px · z∈[-58,+37]mm · 9 of 27 slices shown (2 of 2)]
[im 1/27]
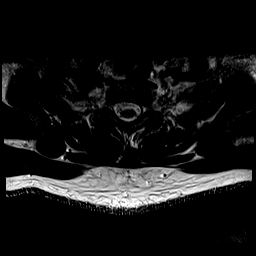
[im 5/27]
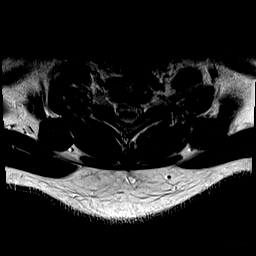
[im 8/27]
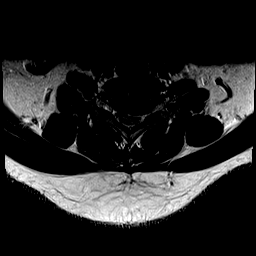
[im 12/27]
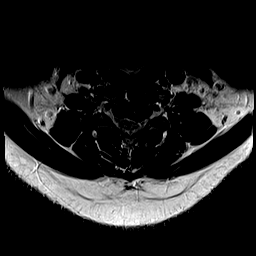
[im 15/27]
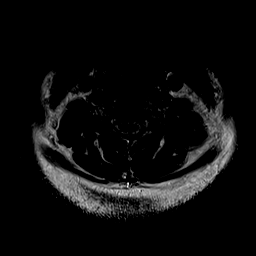
[im 19/27]
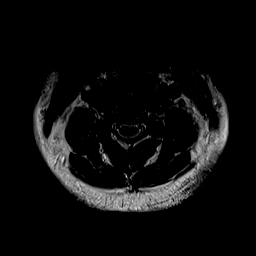
[im 22/27]
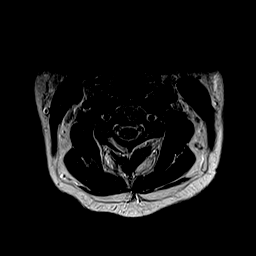
[im 24/27]
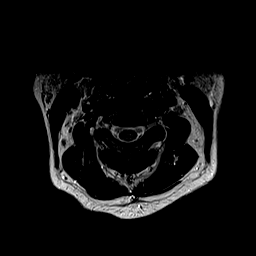
[im 27/27]
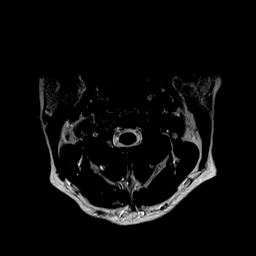

[29 of 48 positions shown; findings below may reference images not displayed]

FINDINGS: Alignment: There is straightening of the normal cervical lordosis
and trace retrolisthesis C5 on C6 and C6 on C7.

Vertebrae: No fracture, evidence of discitis, or bone lesion. The
patient is status post C5-7 anterior fusion. On the prior plain
films, there is lucency about the screw in C5 and the screw extends
toward the C5-6 disc interspace.

Cord: Normal signal throughout.

Posterior Fossa, vertebral arteries, paraspinal tissues: Negative.

Disc levels:

C2-3: Negative.

C3-4: Negative.

C4-5: There is a shallow disc bulge and mild uncovertebral disease.
The central canal and foramina remain open.

C5-6: Status post anterior fusion. A left paracentral endplate spur
appears to contact the cord. The foramina open.

C6-7: Status post fusion. Endplate spurring and uncovertebral
disease result in the mild deformity of the ventral cord and mild to
moderate left foraminal narrowing. The right foramen appears open.

C7-T1: Negative.
IMPRESSION: Status post C5-7 anterior fusion. Based on correlation with the
prior plain films, the appearance of C5-6 is worrisome for
pseudoarthrosis where there is lucency about the C5 screw on the
prior plain films. CT scan of the cervical spine could be used for
further evaluation.

Left paracentral endplate spur at C5-6 contacts the ventral cord.

Endplate spurring and uncovertebral disease result in mild deformity
of the ventral cord and mild to moderate left foraminal narrowing at
C6-7.

Shallow disc bulge C4-5 without stenosis.

## 2021-06-19 ENCOUNTER — Other Ambulatory Visit: Payer: Self-pay | Admitting: Pain Medicine

## 2021-06-19 DIAGNOSIS — G8929 Other chronic pain: Secondary | ICD-10-CM

## 2021-06-19 DIAGNOSIS — M545 Low back pain, unspecified: Secondary | ICD-10-CM

## 2021-06-21 ENCOUNTER — Other Ambulatory Visit: Payer: Self-pay | Admitting: Student

## 2021-07-05 ENCOUNTER — Ambulatory Visit
Admission: RE | Admit: 2021-07-05 | Discharge: 2021-07-05 | Disposition: A | Discharge status: Home / Self Care | Payer: Medicare Other | Source: Ambulatory Visit | Attending: Pain Medicine | Admitting: Pain Medicine

## 2021-07-05 DIAGNOSIS — M545 Low back pain, unspecified: Secondary | ICD-10-CM

## 2021-07-05 IMAGING — MR MR LUMBAR SPINE W/O CM
4 of 5 series · 26 of 48 positions shown · non-contrast
Comparison: Lumbar spine radiographs [DATE]

CLINICAL DATA: Low back pain

EXAM:
MRI LUMBAR SPINE WITHOUT CONTRAST
TECHNIQUE: Multiplanar, multisequence MR imaging of the lumbar spine was
performed. No intravenous contrast was administered.

[Series 3: T2 · sagittal · 4.0mm · 0.53mm/px · 6 of 15 slices shown (1 of 2)]
[im 1/15]
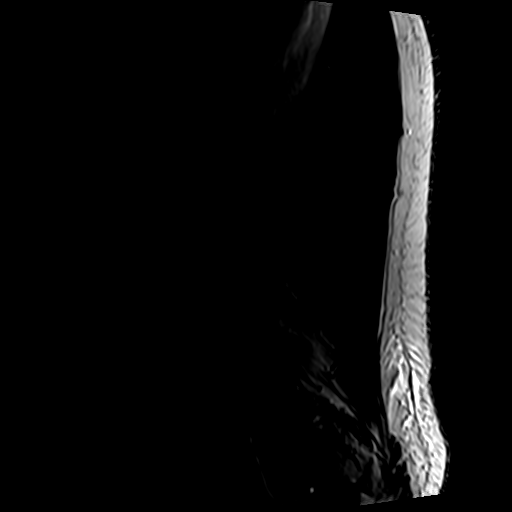
[im 3/15]
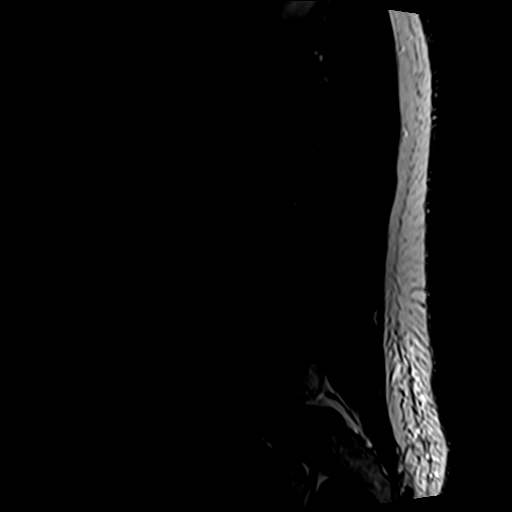
[im 6/15]
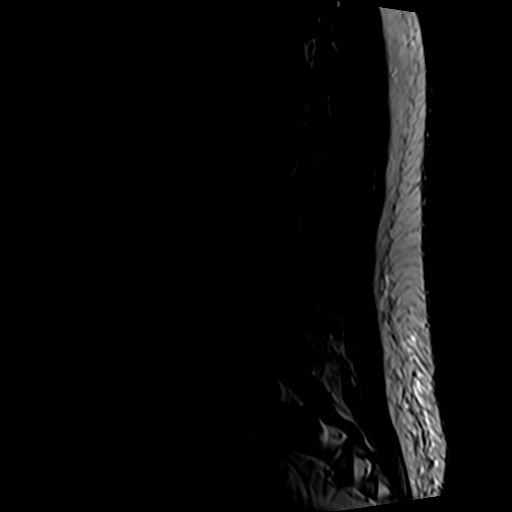
[im 9/15]
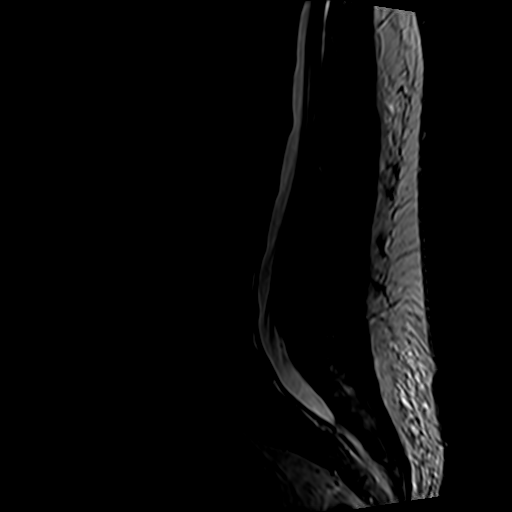
[im 12/15]
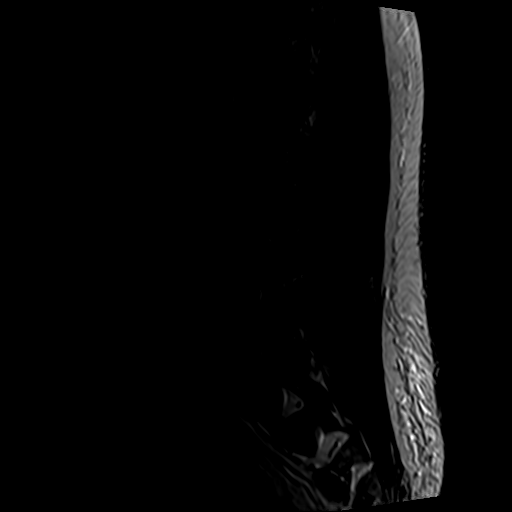
[im 15/15]
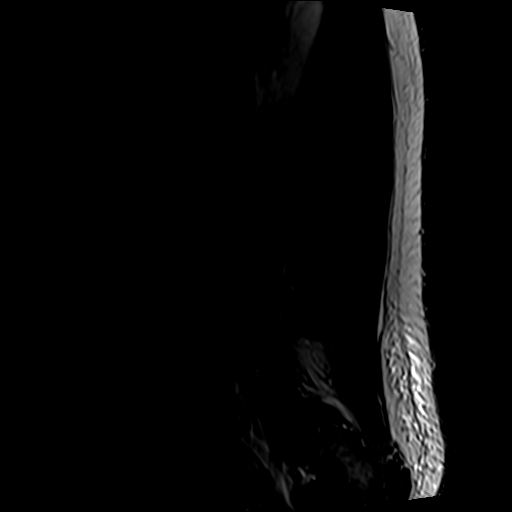

[Series 5: T1 · sagittal · 4.0mm · 0.53mm/px · 6 of 15 slices shown (1 of 2)]
[im 1/15]
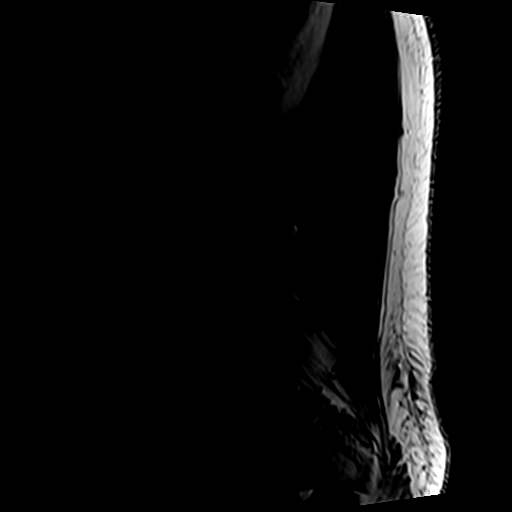
[im 3/15]
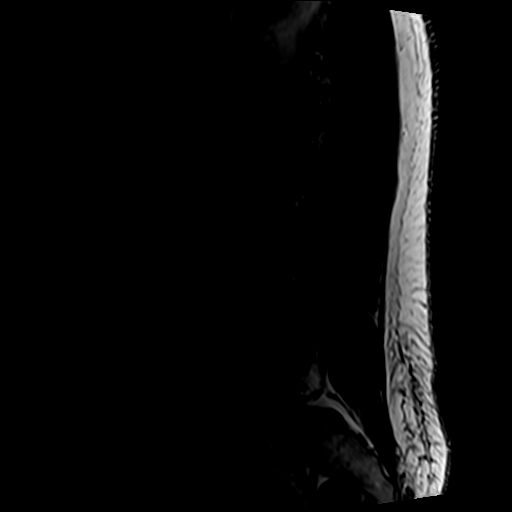
[im 6/15]
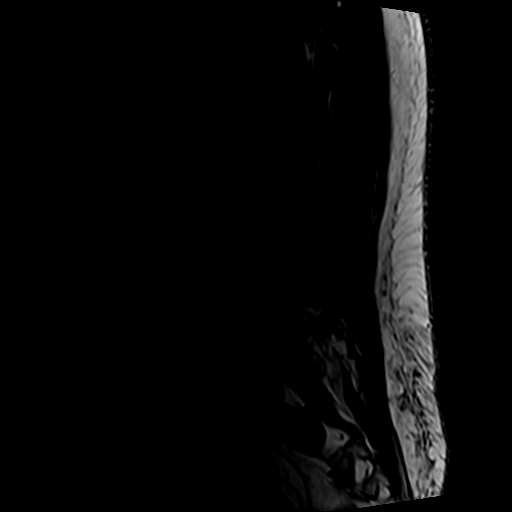
[im 9/15]
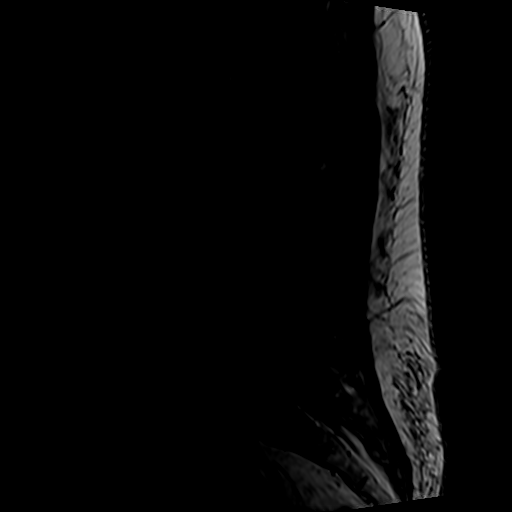
[im 12/15]
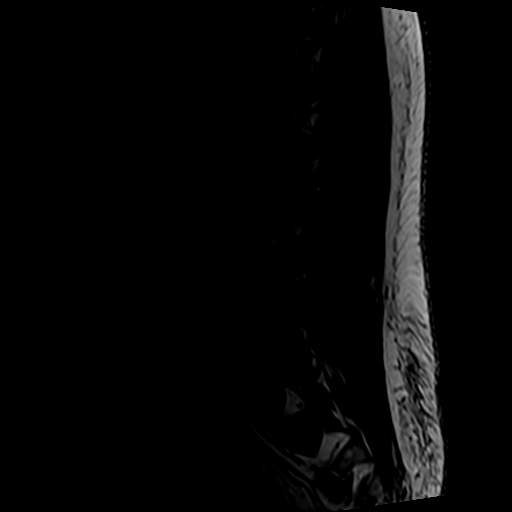
[im 15/15]
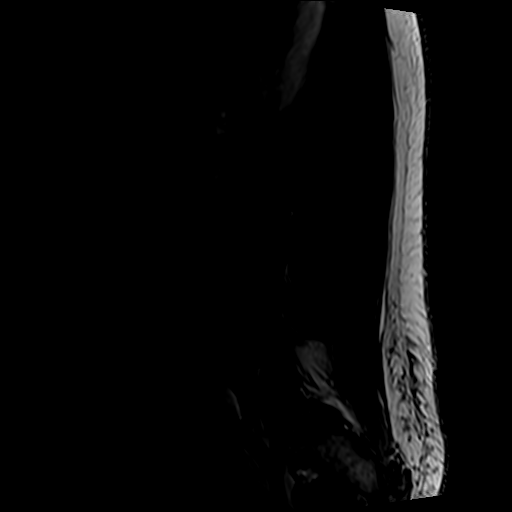

[Series 6: T2 · axial · 4.0mm · 0.70mm/px · z∈[-3,+205]mm · 9 of 38 slices shown (2 of 2)]
[im 1/38]
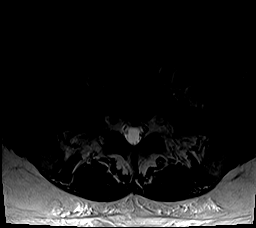
[im 6/38]
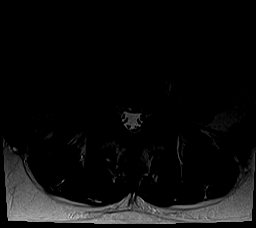
[im 11/38]
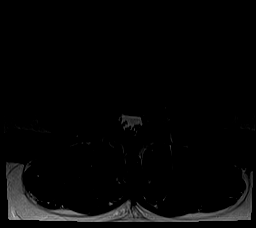
[im 16/38]
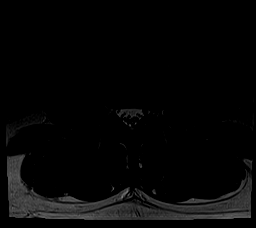
[im 19/38]
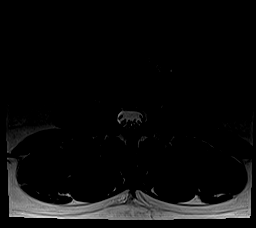
[im 22/38]
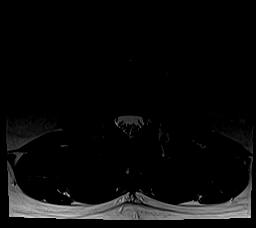
[im 27/38]
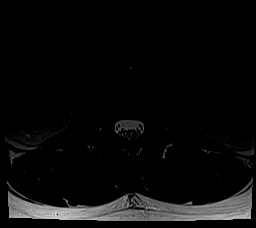
[im 32/38]
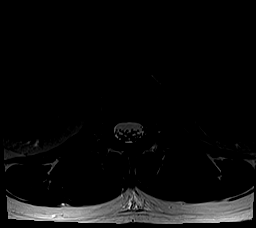
[im 38/38]
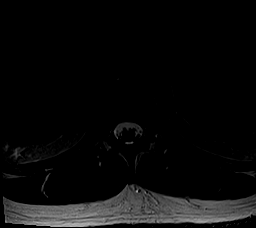

[Series 7: T1 · axial · 4.0mm · 0.35mm/px · z∈[-3,+174]mm · 5 of 38 slices shown (2 of 2)]
[im 1/38]
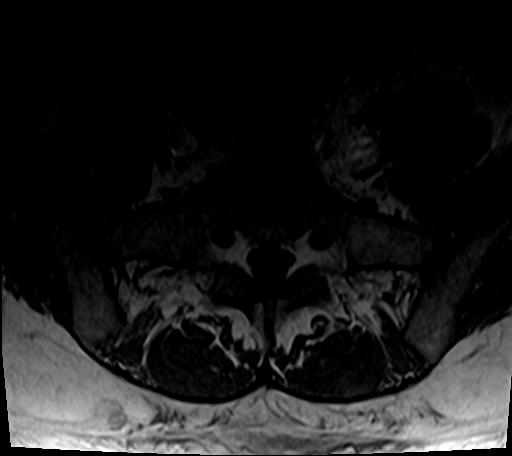
[im 6/38]
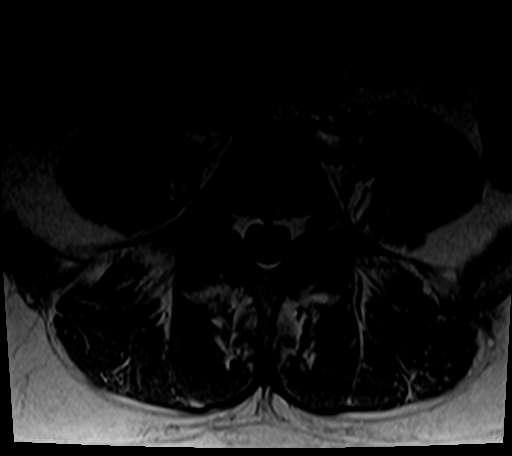
[im 11/38]
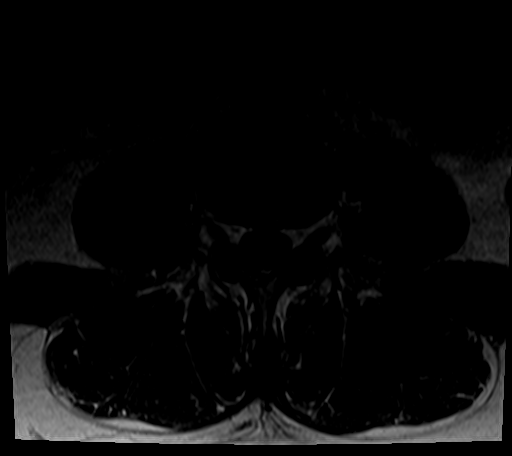
[im 19/38]
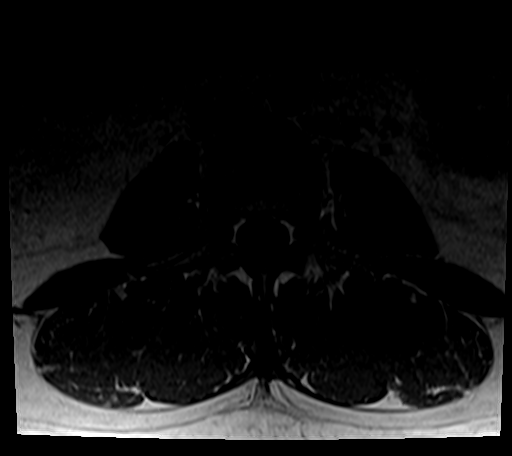
[im 32/38]
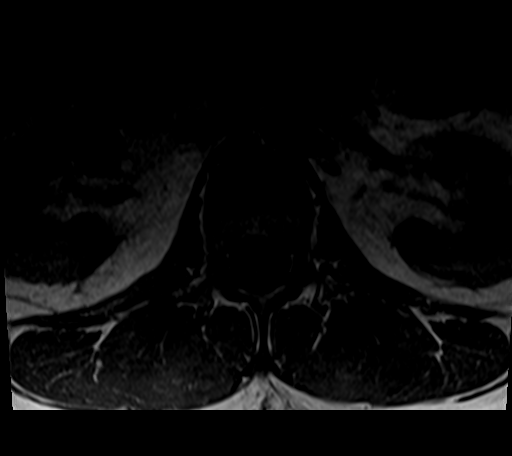

[26 of 48 positions shown; findings below may reference images not displayed]

FINDINGS: Segmentation:  Standard

Alignment:  Normal

Vertebrae:  Normal bone marrow.  Negative for fracture or mass.

Conus medullaris and cauda equina: Conus extends to the L1-2 level.
Conus and cauda equina appear normal.

Paraspinal and other soft tissues: Negative for paraspinous mass or
adenopathy.

Disc levels:

L1-2: Mild disc degeneration and Schmorl's node. Negative for
stenosis

L2-3: Mild disc degeneration and Schmorl's node. Negative for
stenosis

L3-4: Mild disc degeneration and Schmorl's node. Negative for
stenosis

L4-5: Mild disc space narrowing and disc bulging. Mild facet
degeneration. Negative for stenosis

L5-S1: Small extruded disc fragment to the left of midline. Disc
fragment extends caudally. Possible left S1 nerve root impingement.
IMPRESSION: Mild lumbar degenerative changes at multiple levels

Small extruded disc fragment to left midline at L5-S1. Possible left
S1 nerve root impingement.

## 2021-07-18 DIAGNOSIS — M545 Low back pain, unspecified: Secondary | ICD-10-CM | POA: Insufficient documentation

## 2021-07-18 DIAGNOSIS — G8929 Other chronic pain: Secondary | ICD-10-CM

## 2021-07-18 DIAGNOSIS — F4310 Post-traumatic stress disorder, unspecified: Secondary | ICD-10-CM

## 2021-07-18 DIAGNOSIS — G4709 Other insomnia: Secondary | ICD-10-CM

## 2021-07-18 DIAGNOSIS — Z794 Long term (current) use of insulin: Secondary | ICD-10-CM | POA: Insufficient documentation

## 2021-07-18 DIAGNOSIS — E118 Type 2 diabetes mellitus with unspecified complications: Secondary | ICD-10-CM

## 2021-07-18 DIAGNOSIS — E782 Mixed hyperlipidemia: Secondary | ICD-10-CM | POA: Insufficient documentation

## 2021-07-18 DIAGNOSIS — F319 Bipolar disorder, unspecified: Secondary | ICD-10-CM | POA: Insufficient documentation

## 2021-07-18 HISTORY — DX: Mixed hyperlipidemia: E78.2

## 2021-07-18 HISTORY — DX: Post-traumatic stress disorder, unspecified: F43.10

## 2021-07-18 HISTORY — DX: Other chronic pain: G89.29

## 2021-07-18 HISTORY — DX: Type 2 diabetes mellitus with unspecified complications: E11.8

## 2021-07-18 HISTORY — DX: Other insomnia: G47.09

## 2021-07-18 HISTORY — DX: Bipolar disorder, unspecified: F31.9

## 2021-07-31 ENCOUNTER — Ambulatory Visit (INDEPENDENT_AMBULATORY_CARE_PROVIDER_SITE_OTHER): Payer: Medicare Other | Admitting: Psychiatry

## 2021-07-31 ENCOUNTER — Encounter: Payer: Self-pay | Admitting: Psychiatry

## 2021-07-31 ENCOUNTER — Other Ambulatory Visit: Payer: Self-pay

## 2021-07-31 DIAGNOSIS — F319 Bipolar disorder, unspecified: Secondary | ICD-10-CM

## 2021-07-31 DIAGNOSIS — F4310 Post-traumatic stress disorder, unspecified: Secondary | ICD-10-CM | POA: Diagnosis not present

## 2021-07-31 DIAGNOSIS — F411 Generalized anxiety disorder: Secondary | ICD-10-CM | POA: Diagnosis not present

## 2021-07-31 DIAGNOSIS — G4700 Insomnia, unspecified: Secondary | ICD-10-CM

## 2021-07-31 MED ORDER — ZOLPIDEM TARTRATE 10 MG PO TABS: ORAL_TABLET | ORAL | 2 refills | Status: DC

## 2021-07-31 MED ORDER — BUPROPION HCL ER (XL) 300 MG PO TB24: ORAL_TABLET | ORAL | 0 refills | Status: DC

## 2021-07-31 MED ORDER — LATUDA 60 MG PO TABS: ORAL_TABLET | ORAL | 5 refills | Status: DC

## 2021-07-31 MED ORDER — PREGABALIN 200 MG PO CAPS: 200.0000 mg | ORAL_CAPSULE | Freq: Three times a day (TID) | ORAL | 2 refills | Status: DC

## 2021-07-31 MED ORDER — HYDROXYZINE HCL 25 MG PO TABS: 25.0000 mg | ORAL_TABLET | Freq: Every day | ORAL | 1 refills | Status: DC

## 2021-07-31 MED ORDER — BUSPIRONE HCL 15 MG PO TABS: 15.0000 mg | ORAL_TABLET | Freq: Two times a day (BID) | ORAL | 0 refills | Status: DC

## 2021-07-31 MED ORDER — BREXPIPRAZOLE 2 MG PO TABS: 2.0000 mg | ORAL_TABLET | Freq: Every day | ORAL | 1 refills | Status: DC

## 2021-07-31 MED ORDER — BREXPIPRAZOLE 1 MG PO TABS: ORAL_TABLET | ORAL | 0 refills | Status: DC

## 2021-07-31 NOTE — Progress Notes (Signed)
Julie Abbott [id number] 06-12-1976 45 y.o.  Subjective:   Patient ID:  Julie Abbott is a 45 y.o. (DOB October 17, 1976) female.  Chief Complaint:  Chief Complaint  Patient presents with   Anxiety   Manic Behavior   Sleeping Problem     HPI Julie Abbott presents to the office today for follow-up of PTSD and mood disturbance. She reports that she had some excessive spending last month and got new credit cards and spent funds on these. Reports that she is now having difficulty sleeping due to worry about finances. She reports frequent worry, rumination, and panic. Estimates sleeping about 2-3 hours a night. Reports racing thoughts. Describes some mood lability. Periods of depression. She reports that she has periods where she is eating excessively and other times she is not interested in food at all. Energy has been "normal to low, most times." She reports low motivation. She reports that she does not want to be around people and get out of the house. She reports poor concentration. Denies SI.   Reports that she has been picking at her eye brows.   Daughter gave up parental rights of her daughter (Laelah's granddaughter).   She reports that she has financial strain and difficulty covering medical expenses. She is trying to learn which services she qualifies for.   Past Psychiatric Medication Trials: Ritalin- Prescribed when she was 45 yo. Adderall- was taking before pain medication. Would have "crash" in the afternoon. Latuda- Was taking in [place] at 80 mg BID. Reports that Latuda seemed to decrease frequency of manic s/s. Depression was less. Denies side effects Vraylar- Ineffective Abilify Risperdal Geodon Seroquel- Increased activation. Lamictal- Tolerated ok. Had some arthralgias. Depakote Trileptal Wellbutrin- Has taken long-term. Was increased to 300 mg po qd in the last 1-2 years. Denies side effects. Helps with decreasing smoking. Citalopram Cymbalta Buspar- Started by PCP in  [place] after major stressor before moving to [place]. Thinks it is somewhat helpful for anxiety and rumination. Denies side effective. Ambien- Most effective for sleep. Causes some impulsivity. Has taken for about 10 years Lunesta- ineffective. Melatonin Hydroxyzine Lyrica- Prescribed for pain Gabapentin-Adverse reaction Belsomra  AIMS    Flowsheet Row Office Visit from 10/30/2020 in [facility] Office Visit from 04/10/2020 in [facility]  AIMS Total Score 3 3        Review of Systems:  Review of Systems  Musculoskeletal:  Negative for gait problem.  Neurological:  Positive for tremors.       Nerve pain  Psychiatric/Behavioral:         Please refer to HPI   Medications: I have reviewed the patient's current medications.  Current Outpatient Medications  Medication Sig Dispense Refill   brexpiprazole (REXULTI) 1 MG TABS tablet Take 1 mg po qd for one week, then increase to 2 mg po qd 30 tablet 0   brexpiprazole (REXULTI) 2 MG TABS tablet Take 1 tablet (2 mg total) by mouth daily. 30 tablet 1   buprenorphine (SUBUTEX) 8 MG SUBL SL tablet Place 8 mg under the tongue 2 (two) times daily.     cyclobenzaprine (FLEXERIL) 10 MG tablet 2 (two) times a day as needed.     estradiol (CLIMARA) 0.06 MG/24HR 1 patch once a week.     LINZESS 72 MCG capsule Take 72 mcg by mouth daily.     losartan (COZAAR) 25 MG tablet Take by mouth.     rosuvastatin (CRESTOR) 10 MG tablet Take by mouth.     buPROPion (  WELLBUTRIN XL) 300 MG 24 hr tablet TAKE 1 TABLET BY MOUTH ONCE DAILY IN THE MORNING 90 tablet 0   busPIRone (BUSPAR) 15 MG tablet Take 1 tablet (15 mg total) by mouth 2 (two) times daily. 180 tablet 0   ELMIRON 100 MG capsule TAKE 1 CAPSULE BY MOUTH 30 MINUTES BEFORE MEALS (Patient not taking: Reported on 02/07/2021)     hydrOXYzine (ATARAX/VISTARIL) 25 MG tablet Take 1 tablet (25 mg total) by mouth at bedtime. 90 tablet 1   LANTUS SOLOSTAR 100 UNIT/ML Solostar Pen  SMARTSIG:60 Unit(s) SUB-Q Every Night (Patient not taking: Reported on 07/31/2021)     Lurasidone HCl (LATUDA) 60 MG TABS Take 1/2 tablet for one week, then stop 30 tablet 5   OZEMPIC, 1 MG/DOSE, 4 MG/3ML SOPN INJECT 1 MG ONCE A WEEK SAME DAY EACH WEEK FOR DIABETES (Patient not taking: Reported on 07/31/2021)     [START ON 08/18/2021] pregabalin (LYRICA) 200 MG capsule Take 1 capsule (200 mg total) by mouth 3 (three) times daily. 90 capsule 2   [START ON 08/28/2021] zolpidem (AMBIEN) 10 MG tablet Take 1/2-1 tablet at bedtime 30 tablet 2   No current facility-administered medications for this visit.    Medication Side Effects: None  Allergies:  Allergies  Allergen Reactions   Bee Venom    Gabapentin    Lortab [Hydrocodone-Acetaminophen]     Past Medical History:  Diagnosis Date   Back pain    Diabetes mellitus, type II (HCC)    Interstitial cystitis    PCOS (polycystic ovarian syndrome)     Past Medical History, Surgical history, Social history, and Family history were reviewed and updated as appropriate.   Please see review of systems for further details on the patient's review from today.   Objective:   Physical Exam:  There were no vitals taken for this visit.  Physical Exam Constitutional:      General: She is not in acute distress. Musculoskeletal:        General: No deformity.  Neurological:     Mental Status: She is alert and oriented to person, place, and time.     Coordination: Coordination normal.  Psychiatric:        Attention and Perception: Attention and perception normal. She does not perceive auditory or visual hallucinations.        Mood and Affect: Mood is anxious. Affect is not labile, blunt, angry or inappropriate.        Speech: Speech is rapid and pressured.        Behavior: Behavior normal.        Thought Content: Thought content normal. Thought content is not paranoid or delusional. Thought content does not include homicidal or suicidal ideation.  Thought content does not include homicidal or suicidal plan.        Cognition and Memory: Cognition and memory normal.        Judgment: Judgment normal.     Comments: Insight intact Dysphoric mood Perseverates on finances     Lab Review:  No results found for: NA, K, CL, CO2, GLUCOSE, BUN, CREATININE, CALCIUM, PROT, ALBUMIN, AST, ALT, ALKPHOS, BILITOT, GFRNONAA, GFRAA  No results found for: WBC, RBC, HGB, HCT, PLT, MCV, MCH, MCHC, RDW, LYMPHSABS, MONOABS, EOSABS, BASOSABS  No results found for: POCLITH, LITHIUM   No results found for: PHENYTOIN, PHENOBARB, VALPROATE, CBMZ   .res Assessment: Plan:    Patient seen for 30 minutes and time spent discussing possible treatment options.  Patient reports that Jordan  no longer seems to be effective for her and would like to consider alternative treatment options.  Discussed potential benefits, risks, and side effects of Rexulti.  Patient agrees to trial of Rexulti.  Will start Rexulti 1 mg daily for 1 week, then increase to 2 mg daily for mood signs and symptoms.  Discussed that Rexulti is not indicated for anxiety but may be helpful for some of her anxiety signs and symptoms. Will decrease Latuda to 60 mg 1/2 tablet for 1 week, then discontinue. Continue Wellbutrin XL 300 mg daily for depression. Continue Lyrica 200 mg 3 times daily for mood and anxiety signs and symptoms. Continue Ambien 10 mg 1/2 to 1 tablet at bedtime for insomnia. Continue hydroxyzine 25 g time for anxiety and insomnia. Patient to follow-up in 4 to 6 weeks or sooner if clinically indicated. Patient advised to contact office with any questions, adverse effects, or acute worsening in signs and symptoms.   Renee was seen today for anxiety, manic behavior and sleeping problem.  Diagnoses and all orders for this visit:  Bipolar affective disorder, remission status unspecified (HCC) -     brexpiprazole (REXULTI) 1 MG TABS tablet; Take 1 mg po qd for one week, then increase  to 2 mg po qd -     brexpiprazole (REXULTI) 2 MG TABS tablet; Take 1 tablet (2 mg total) by mouth daily. -     buPROPion (WELLBUTRIN XL) 300 MG 24 hr tablet; TAKE 1 TABLET BY MOUTH ONCE DAILY IN THE MORNING -     pregabalin (LYRICA) 200 MG capsule; Take 1 capsule (200 mg total) by mouth 3 (three) times daily. -     Lurasidone HCl (LATUDA) 60 MG TABS; Take 1/2 tablet for one week, then stop  PTSD (post-traumatic stress disorder) -     brexpiprazole (REXULTI) 1 MG TABS tablet; Take 1 mg po qd for one week, then increase to 2 mg po qd -     brexpiprazole (REXULTI) 2 MG TABS tablet; Take 1 tablet (2 mg total) by mouth daily. -     busPIRone (BUSPAR) 15 MG tablet; Take 1 tablet (15 mg total) by mouth 2 (two) times daily. -     pregabalin (LYRICA) 200 MG capsule; Take 1 capsule (200 mg total) by mouth 3 (three) times daily.  Insomnia, unspecified type -     hydrOXYzine (ATARAX/VISTARIL) 25 MG tablet; Take 1 tablet (25 mg total) by mouth at bedtime. -     zolpidem (AMBIEN) 10 MG tablet; Take 1/2-1 tablet at bedtime  GAD (generalized anxiety disorder) -     hydrOXYzine (ATARAX/VISTARIL) 25 MG tablet; Take 1 tablet (25 mg total) by mouth at bedtime.    Please see After Visit Summary for patient specific instructions.  Future Appointments  Date Time Provider Department Center  10/02/2021 10:00 AM Corie Chiquito, PMHNP [facility] None    No orders of the defined types were placed in this encounter.   -------------------------------

## 2021-10-02 ENCOUNTER — Other Ambulatory Visit: Payer: Self-pay

## 2021-10-02 ENCOUNTER — Ambulatory Visit: Payer: Medicare Other | Admitting: Psychiatry

## 2021-10-02 ENCOUNTER — Encounter: Payer: Self-pay | Admitting: Psychiatry

## 2021-10-02 DIAGNOSIS — F4310 Post-traumatic stress disorder, unspecified: Secondary | ICD-10-CM

## 2021-10-02 DIAGNOSIS — G4700 Insomnia, unspecified: Secondary | ICD-10-CM | POA: Diagnosis not present

## 2021-10-02 DIAGNOSIS — F319 Bipolar disorder, unspecified: Secondary | ICD-10-CM | POA: Diagnosis not present

## 2021-10-02 MED ORDER — PREGABALIN 200 MG PO CAPS: 200.0000 mg | ORAL_CAPSULE | Freq: Three times a day (TID) | ORAL | 2 refills | Status: DC

## 2021-10-02 MED ORDER — BUPROPION HCL ER (XL) 300 MG PO TB24: ORAL_TABLET | ORAL | 0 refills | Status: DC

## 2021-10-02 MED ORDER — ZOLPIDEM TARTRATE 10 MG PO TABS: ORAL_TABLET | ORAL | 2 refills | Status: DC

## 2021-10-02 MED ORDER — BREXPIPRAZOLE 2 MG PO TABS: 2.0000 mg | ORAL_TABLET | Freq: Every day | ORAL | 0 refills | Status: DC

## 2021-10-02 MED ORDER — BUSPIRONE HCL 15 MG PO TABS: 15.0000 mg | ORAL_TABLET | Freq: Two times a day (BID) | ORAL | 0 refills | Status: DC

## 2021-10-02 NOTE — Progress Notes (Signed)
Julie Abbott [id number] [birth year] 45 y.o.  Subjective:   Patient ID:  Julie Abbott is a 45 y.o. (DOB [birth year]) female.  Chief Complaint:  Chief Complaint  Patient presents with   Depression   Anxiety    HPI Julie Abbott presents to the office today for follow-up of mood disturbance, anxiety, and insomnia. Julie Abbott reports "recent depressive stage... just wanted to cry a lot." Julie Abbott attributes this to multiple changes and trying to adapt to this. Julie Abbott reports weight gain "and I don't know why." Reports that Julie Abbott does not eat much during the day and eats more at night after taking HS meds. Julie Abbott reports "I get down." Julie Abbott reports that change from Latuda to Rexulti "is going ok."   Julie Abbott reports that Julie Abbott has had financial stressors. Julie Abbott reports that Julie Abbott has not been able to pick up some of her meds due to financial constraints. Julie Abbott reports that Julie Abbott has anxiety about different things and describes worry. Has anxiety in response to debt Julie Abbott acquired during manic episode. Has anxiety in places Julie Abbott has not been before. Reports that Julie Abbott went for a mammogram last week and drove to [facility] and felt overwhelmed and was having memory issues. Julie Abbott then called mammography clinic and said that Julie Abbott was not able to find clinic and could not make appointment. Julie Abbott reports that Julie Abbott had difficulty finding her car. Julie Abbott reports difficulty with concentration. Sleep has been poor with multiple awakenings. Energy and motivation have been low. Denies SI.   Husband has been staying at his mother's and Julie Abbott is staying at their house. "We just believe in different things... maybe rushed into things." Reports husband is not supportive of mental health issues.   Has had limited contact with her daughter. Missing oldest granddaughter.   Past Psychiatric Medication Trials: Ritalin- Prescribed when Julie Abbott was 45 yo. Adderall- was taking before pain medication. Would have "crash" in the afternoon. Latuda- Was taking in [place] at 80 mg  BID. Reports that Latuda seemed to decrease frequency of manic s/s. Depression was less. Denies side effects Vraylar- Ineffective Abilify Risperdal Geodon Seroquel- Increased activation. Lamictal- Tolerated ok. Had some arthralgias. Depakote Trileptal Wellbutrin- Has taken long-term. Was increased to 300 mg po qd in the last 1-2 years. Denies side effects. Helps with decreasing smoking. Citalopram Cymbalta Buspar- Started by PCP in [place] after major stressor before moving to [place]. Thinks it is somewhat helpful for anxiety and rumination. Denies side effective. Ambien- Most effective for sleep. Causes some impulsivity. Has taken for about 10 years Lunesta- ineffective. Melatonin Hydroxyzine Lyrica- Prescribed for pain Gabapentin-Adverse reaction Belsomra    AIMS    Flowsheet Row Office Visit from 10/02/2021 in [facility] Office Visit from 10/30/2020 in [facility] Office Visit from 04/10/2020 in [facility]  AIMS Total Score 3 3 3         Review of Systems:  Review of Systems  HENT:  Positive for dental problem.   Endocrine:       Reports improved glycemic control compared to the past.  Musculoskeletal:  Positive for arthralgias. Negative for gait problem.  Neurological:  Positive for tremors.  Psychiatric/Behavioral:         Please refer to HPI   Medications: I have reviewed the patient's current medications.  Current Outpatient Medications  Medication Sig Dispense Refill   buprenorphine (SUBUTEX) 8 MG SUBL SL tablet Place 8 mg under the tongue 2 (two) times daily.     cyclobenzaprine (FLEXERIL) 10 MG tablet  2 (two) times a day as needed.     estradiol (CLIMARA) 0.06 MG/24HR 1 patch once a week.     hydrOXYzine (ATARAX/VISTARIL) 25 MG tablet Take 1 tablet (25 mg total) by mouth at bedtime. 90 tablet 1   losartan (COZAAR) 25 MG tablet Take by mouth.     rosuvastatin (CRESTOR) 10 MG tablet Take by mouth.     brexpiprazole  (REXULTI) 2 MG TABS tablet Take 1 tablet (2 mg total) by mouth daily. 90 tablet 0   buPROPion (WELLBUTRIN XL) 300 MG 24 hr tablet TAKE 1 TABLET BY MOUTH ONCE DAILY IN THE MORNING 90 tablet 0   busPIRone (BUSPAR) 15 MG tablet Take 1 tablet (15 mg total) by mouth 2 (two) times daily. 180 tablet 0   [START ON 11/10/2021] pregabalin (LYRICA) 200 MG capsule Take 1 capsule (200 mg total) by mouth 3 (three) times daily. 90 capsule 2   [START ON 11/26/2021] zolpidem (AMBIEN) 10 MG tablet Take 1/2-1 tablet at bedtime 30 tablet 2   No current facility-administered medications for this visit.    Medication Side Effects: Other: Possible memory difficulties and worsening tremors  Allergies:  Allergies  Allergen Reactions   Bee Venom    Gabapentin    Lortab [Hydrocodone-Acetaminophen]     Past Medical History:  Diagnosis Date   Back pain    Diabetes mellitus, type II (HCC)    Interstitial cystitis    PCOS (polycystic ovarian syndrome)     Past Medical History, Surgical history, Social history, and Family history were reviewed and updated as appropriate.   Please see review of systems for further details on the patient's review from today.   Objective:   Physical Exam:  There were no vitals taken for this visit.  Physical Exam Constitutional:      General: Julie Abbott is not in acute distress. Musculoskeletal:        General: No deformity.  Neurological:     Mental Status: Julie Abbott is alert and oriented to person, place, and time.     Coordination: Coordination normal.  Psychiatric:        Attention and Perception: Attention and perception normal. Julie Abbott does not perceive auditory or visual hallucinations.        Mood and Affect: Mood is anxious and depressed. Affect is not labile, blunt, angry or inappropriate.        Speech: Speech normal.        Behavior: Behavior normal.        Thought Content: Thought content normal. Thought content is not paranoid or delusional. Thought content does not  include homicidal or suicidal ideation. Thought content does not include homicidal or suicidal plan.        Cognition and Memory: Cognition and memory normal.        Judgment: Judgment normal.     Comments: Insight intact    Lab Review:  No results found for: NA, K, CL, CO2, GLUCOSE, BUN, CREATININE, CALCIUM, PROT, ALBUMIN, AST, ALT, ALKPHOS, BILITOT, GFRNONAA, GFRAA  No results found for: WBC, RBC, HGB, HCT, PLT, MCV, MCH, MCHC, RDW, LYMPHSABS, MONOABS, EOSABS, BASOSABS  No results found for: POCLITH, LITHIUM   No results found for: PHENYTOIN, PHENOBARB, VALPROATE, CBMZ   .res Assessment: Plan:   Pt seen for 30 minutes and time spent discussing recent stressors and impact this is having on mood and anxiety s/s. Discussed possible therapy referrals. Pt reports that Julie Abbott will contact insurance to clarify benefits for therapy coverage. Julie Abbott reports that  Julie Abbott would like to continue current medications at this time and re-assess at next visit.  Continue Rexulti 2 mg po qd for mood s/s.  Continue Wellbutrin XL 300 mg po qd for depression.  Continue Buspar 15 mg po BID for anxiety.  Continue Hydroxyzine 25 mg po QHS for anxiety and insomnia.  Continue Lyrica 200 mg po TID for anxiety and mood.  Continue Ambien 10 mg 1/2-1 tab po QHS for insomnia. Pt to follow-up in 2 months or sooner if clinically indicated.  Patient advised to contact office with any questions, adverse effects, or acute worsening in signs and symptoms.    Zaneta was seen today for depression and anxiety.  Diagnoses and all orders for this visit:  Bipolar affective disorder, remission status unspecified (HCC) -     buPROPion (WELLBUTRIN XL) 300 MG 24 hr tablet; TAKE 1 TABLET BY MOUTH ONCE DAILY IN THE MORNING -     brexpiprazole (REXULTI) 2 MG TABS tablet; Take 1 tablet (2 mg total) by mouth daily. -     pregabalin (LYRICA) 200 MG capsule; Take 1 capsule (200 mg total) by mouth 3 (three) times daily.  PTSD (post-traumatic  stress disorder) -     busPIRone (BUSPAR) 15 MG tablet; Take 1 tablet (15 mg total) by mouth 2 (two) times daily. -     brexpiprazole (REXULTI) 2 MG TABS tablet; Take 1 tablet (2 mg total) by mouth daily. -     pregabalin (LYRICA) 200 MG capsule; Take 1 capsule (200 mg total) by mouth 3 (three) times daily.  Insomnia, unspecified type -     zolpidem (AMBIEN) 10 MG tablet; Take 1/2-1 tablet at bedtime    Please see After Visit Summary for patient specific instructions.  Future Appointments  Date Time Provider Department Center  12/04/2021 11:00 AM Corie Chiquito, PMHNP [facility] None     No orders of the defined types were placed in this encounter.   -------------------------------

## 2021-11-10 DIAGNOSIS — M4712 Other spondylosis with myelopathy, cervical region: Secondary | ICD-10-CM

## 2021-11-10 HISTORY — DX: Other spondylosis with radiculopathy, cervical region: M47.12

## 2021-12-04 ENCOUNTER — Ambulatory Visit: Payer: Medicare Other | Admitting: Psychiatry

## 2021-12-06 ENCOUNTER — Ambulatory Visit: Payer: Medicare Other | Admitting: Psychiatry

## 2021-12-06 ENCOUNTER — Other Ambulatory Visit: Payer: Self-pay

## 2021-12-06 ENCOUNTER — Encounter: Payer: Self-pay | Admitting: Psychiatry

## 2021-12-06 DIAGNOSIS — F411 Generalized anxiety disorder: Secondary | ICD-10-CM

## 2021-12-06 DIAGNOSIS — G4700 Insomnia, unspecified: Secondary | ICD-10-CM | POA: Diagnosis not present

## 2021-12-06 DIAGNOSIS — F4310 Post-traumatic stress disorder, unspecified: Secondary | ICD-10-CM

## 2021-12-06 DIAGNOSIS — F319 Bipolar disorder, unspecified: Secondary | ICD-10-CM | POA: Diagnosis not present

## 2021-12-06 MED ORDER — BUSPIRONE HCL 15 MG PO TABS: 15.0000 mg | ORAL_TABLET | Freq: Two times a day (BID) | ORAL | 0 refills | Status: DC

## 2021-12-06 MED ORDER — BREXPIPRAZOLE 2 MG PO TABS: 2.0000 mg | ORAL_TABLET | Freq: Every day | ORAL | 0 refills | Status: DC

## 2021-12-06 MED ORDER — BUPROPION HCL ER (XL) 300 MG PO TB24: ORAL_TABLET | ORAL | 0 refills | Status: DC

## 2021-12-06 MED ORDER — HYDROXYZINE HCL 25 MG PO TABS: 25.0000 mg | ORAL_TABLET | Freq: Every day | ORAL | 1 refills | Status: DC

## 2021-12-06 MED ORDER — ZOLPIDEM TARTRATE 10 MG PO TABS: ORAL_TABLET | ORAL | 2 refills | Status: DC

## 2021-12-06 NOTE — Progress Notes (Signed)
Julie Abbott [id number] [birth year] 46 y.o.  Subjective:   Patient ID:  Julie Abbott is a 46 y.o. (DOB August 08, 1976) female.  Chief Complaint:  Chief Complaint  Patient presents with   Anxiety   Depression   Insomnia    HPI Julie Abbott presents to the office today for follow-up of mood disturbance, anxiety, and insomnia. She reports that "holidays are over and I can breathe a little bit." She reports continued financial stressors. "I think I am handling things better." She reports mood has been "alright, there are times I get down." Sadness in response to not being able to see granddaughter. She reports that she is not crying as much. Denies any significant manic s/s. She reports that her sleep is disrupted and fragmented. She reports that she will jolt awake and sometimes will wonder if she turned things off or forgot something. She reports difficulty with concentration. Energy is low. Motivation is also low. Denies SI.   She reports that her memory is "getting a lot worse." She reports that she has difficulty finding words and at times explaining things. She reports that she is forgetting appointments, directions, what she was about to do, etc. Reports that she made an apt and could not recall why she made it. Uses [employer] to avoid missing payments. She reports memory issues cause her frustration. She thinks that memory issues worsened with Subutex. She reports that cognitive issues were not as severe when she was on Ambien and Lyrica without Subutex.   Close with daughter. Daughter is thinking about moving.   Past Psychiatric Medication Trials: Ritalin- Prescribed when she was 46 yo. Adderall- was taking before pain medication. Would have "crash" in the afternoon. Latuda- Was taking in [place] at 80 mg BID. Reports that Latuda seemed to decrease frequency of manic s/s. Depression was less. Denies side effects Vraylar- Ineffective Abilify Risperdal Geodon Seroquel- Increased  activation. Lamictal- Tolerated ok. Had some arthralgias. Depakote Trileptal Wellbutrin- Has taken long-term. Was increased to 300 mg po qd in the last 1-2 years. Denies side effects. Helps with decreasing smoking. Citalopram Cymbalta Buspar- Started by PCP in [place] after major stressor before moving to [place]. Thinks it is somewhat helpful for anxiety and rumination. Denies side effective. Ambien- Most effective for sleep. Causes some impulsivity. Has taken for about 10 years Lunesta- ineffective. Melatonin Hydroxyzine Lyrica- Prescribed for pain Gabapentin-Adverse reaction Belsomra AIMS    Flowsheet Row Office Visit from 10/02/2021 in [facility] Office Visit from 10/30/2020 in [facility] Office Visit from 04/10/2020 in [facility]  AIMS Total Score 3 3 3         Review of Systems:  Review of Systems  Musculoskeletal:  Positive for back pain. Negative for gait problem.       Pain in right leg  Neurological:  Negative for tremors.  Psychiatric/Behavioral:         Please refer to HPI   Medications: I have reviewed the patient's current medications.  Current Outpatient Medications  Medication Sig Dispense Refill   buprenorphine (SUBUTEX) 8 MG SUBL SL tablet Place 8 mg under the tongue 2 (two) times daily.     cyclobenzaprine (FLEXERIL) 10 MG tablet 2 (two) times a day as needed.     estradiol (CLIMARA) 0.06 MG/24HR 1 patch once a week.     losartan (COZAAR) 25 MG tablet Take by mouth.     pregabalin (LYRICA) 200 MG capsule Take 1 capsule (200 mg total) by mouth 3 (three) times daily.  90 capsule 2   rosuvastatin (CRESTOR) 10 MG tablet Take by mouth.     brexpiprazole (REXULTI) 2 MG TABS tablet Take 1 tablet (2 mg total) by mouth daily. 90 tablet 0   buPROPion (WELLBUTRIN XL) 300 MG 24 hr tablet TAKE 1 TABLET BY MOUTH ONCE DAILY IN THE MORNING 90 tablet 0   busPIRone (BUSPAR) 15 MG tablet Take 1 tablet (15 mg total) by mouth 2 (two)  times daily. 180 tablet 0   hydrOXYzine (ATARAX) 25 MG tablet Take 1 tablet (25 mg total) by mouth at bedtime. 90 tablet 1   [START ON 01/08/2022] zolpidem (AMBIEN) 10 MG tablet Take 1/2-1 tablet at bedtime 30 tablet 2   No current facility-administered medications for this visit.    Medication Side Effects: Other: Possible cognitive side effects  Allergies:  Allergies  Allergen Reactions   Bee Venom    Gabapentin    Lortab [Hydrocodone-Acetaminophen]     Past Medical History:  Diagnosis Date   Back pain    Diabetes mellitus, type II (HCC)    Interstitial cystitis    PCOS (polycystic ovarian syndrome)     Past Medical History, Surgical history, Social history, and Family history were reviewed and updated as appropriate.   Please see review of systems for further details on the patient's review from today.   Objective:   Physical Exam:  There were no vitals taken for this visit.  Physical Exam Constitutional:      General: She is not in acute distress. Musculoskeletal:        General: No deformity.  Neurological:     Mental Status: She is alert and oriented to person, place, and time.     Coordination: Coordination normal.  Psychiatric:        Attention and Perception: Attention and perception normal. She does not perceive auditory or visual hallucinations.        Mood and Affect: Mood is anxious and depressed. Affect is not labile, blunt, angry or inappropriate.        Speech: Speech normal.        Behavior: Behavior normal.        Thought Content: Thought content normal. Thought content is not paranoid or delusional. Thought content does not include homicidal or suicidal ideation. Thought content does not include homicidal or suicidal plan.        Cognition and Memory: Cognition and memory normal.        Judgment: Judgment normal.     Comments: Insight intact    Lab Review:  No results found for: NA, K, CL, CO2, GLUCOSE, BUN, CREATININE, CALCIUM, PROT, ALBUMIN,  AST, ALT, ALKPHOS, BILITOT, GFRNONAA, GFRAA  No results found for: WBC, RBC, HGB, HCT, PLT, MCV, MCH, MCHC, RDW, LYMPHSABS, MONOABS, EOSABS, BASOSABS  No results found for: POCLITH, LITHIUM   No results found for: PHENYTOIN, PHENOBARB, VALPROATE, CBMZ   .res Assessment: Plan:    Pt seen for 30 minutes and time spent discussing recent s/s and response to medications. She reports that her mood and anxiety s/s have been more manageable recently. Will therefore continue current medications without changes at this time.  Continue Rexulti 2 mg po qd for mood s/s.  Continue Wellbutrin XL 300 mg po q am for depression and off-label indication for ADHD.  Continue Buspar 15 mg po BID for anxiety.  Continue Hydroxyzine 25 mg po QHS for insomnia.  Continue Lyrica 200 mg TID for anxiety and mood stabilization.  Continue Ambien 10 mg  1/2-1 tablet at bedtime for insomnia.  Pt to follow-up in 2 months or sooner if clinically indicated.  Patient advised to contact office with any questions, adverse effects, or acute worsening in signs and symptoms.   Julie Abbott was seen today for anxiety, depression and insomnia.  Diagnoses and all orders for this visit:  Bipolar affective disorder, remission status unspecified (HCC) -     brexpiprazole (REXULTI) 2 MG TABS tablet; Take 1 tablet (2 mg total) by mouth daily. -     buPROPion (WELLBUTRIN XL) 300 MG 24 hr tablet; TAKE 1 TABLET BY MOUTH ONCE DAILY IN THE MORNING  PTSD (post-traumatic stress disorder) -     brexpiprazole (REXULTI) 2 MG TABS tablet; Take 1 tablet (2 mg total) by mouth daily. -     busPIRone (BUSPAR) 15 MG tablet; Take 1 tablet (15 mg total) by mouth 2 (two) times daily.  Insomnia, unspecified type -     hydrOXYzine (ATARAX) 25 MG tablet; Take 1 tablet (25 mg total) by mouth at bedtime. -     zolpidem (AMBIEN) 10 MG tablet; Take 1/2-1 tablet at bedtime  GAD (generalized anxiety disorder) -     hydrOXYzine (ATARAX) 25 MG tablet; Take 1 tablet  (25 mg total) by mouth at bedtime.     Please see After Visit Summary for patient specific instructions.  Future Appointments  Date Time Provider Department Center  02/04/2022 10:00 AM Corie Chiquitoarter, Nickalous Stingley, PMHNP [facility] None    No orders of the defined types were placed in this encounter.   -------------------------------

## 2021-12-10 ENCOUNTER — Other Ambulatory Visit: Payer: Self-pay | Admitting: Otolaryngology

## 2021-12-10 ENCOUNTER — Other Ambulatory Visit (HOSPITAL_COMMUNITY): Payer: Self-pay | Admitting: Otolaryngology

## 2021-12-10 DIAGNOSIS — R1310 Dysphagia, unspecified: Secondary | ICD-10-CM

## 2021-12-10 DIAGNOSIS — Z90710 Acquired absence of both cervix and uterus: Secondary | ICD-10-CM | POA: Insufficient documentation

## 2021-12-10 HISTORY — DX: Acquired absence of both cervix and uterus: Z90.710

## 2021-12-19 ENCOUNTER — Other Ambulatory Visit: Payer: Self-pay

## 2021-12-19 ENCOUNTER — Ambulatory Visit (HOSPITAL_COMMUNITY)
Admission: RE | Admit: 2021-12-19 | Discharge: 2021-12-19 | Disposition: A | Discharge status: Home / Self Care | Payer: Medicare HMO | Source: Ambulatory Visit | Attending: Otolaryngology | Admitting: Otolaryngology

## 2021-12-19 DIAGNOSIS — R1310 Dysphagia, unspecified: Secondary | ICD-10-CM | POA: Diagnosis present

## 2021-12-19 IMAGING — RF DG ESOPHAGUS
13 of 17 series · 14 of 24 positions shown · non-contrast
Comparison: NONE.
COMPARISON: NONE.

Addendum:
CLINICAL DATA: Chronic dysphagia

EXAM:
ESOPHAGUS/BARIUM SWALLOW/TABLET STUDY
TECHNIQUE: Combined double and single contrast examination was performed using
effervescent crystals, high-density barium, and thin liquid barium.
This exam was performed by APP name, and was supervised and
interpreted by Rad name.
FLUOROSCOPY TIME:  Radiation Exposure Index (as provided by the
fluoroscopic device): 10.3 mGy

[Series 1: cp_standard · 1 of 153 frames shown (1 of 12)]
[frame 23/153]
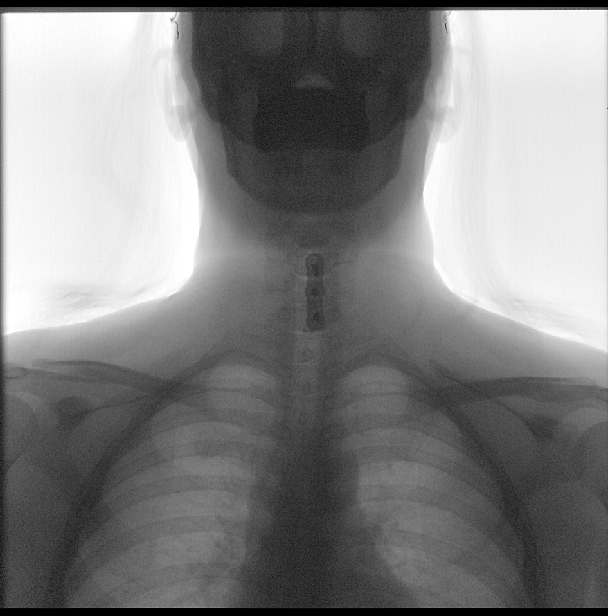

[Series 2: cp_standard · 1 of 15 frames shown (2 of 12)]
[frame 12/15]
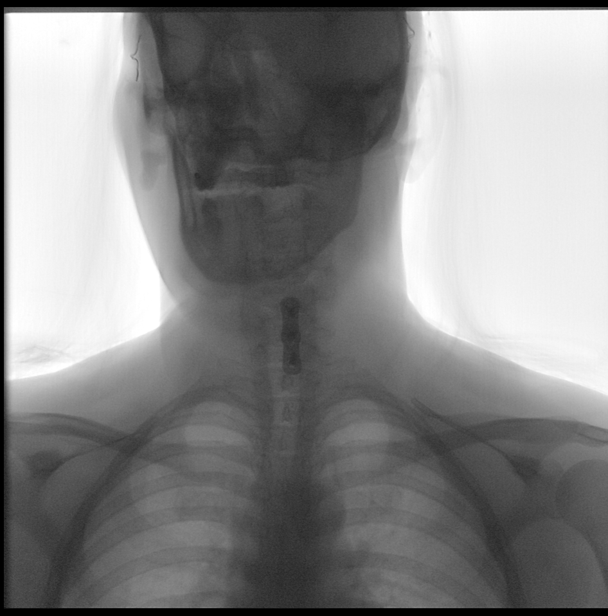

[Series 3: cp_standard · 1 of 137 frames shown (3 of 12)]
[frame 134/137  full-range]
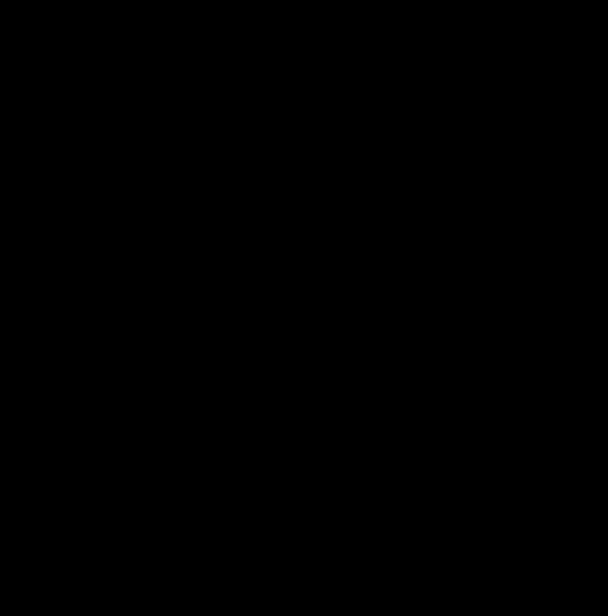

[Series 5: cp_standard · 1 of 137 frames shown (4 of 12)]
[frame 117/137]
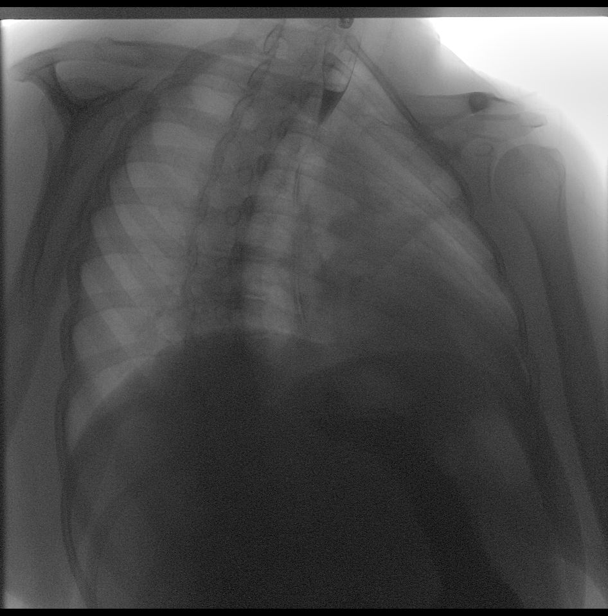

[Series 6: cp_standard · 1 of 59 frames shown (5 of 12)]
[frame 20/59]
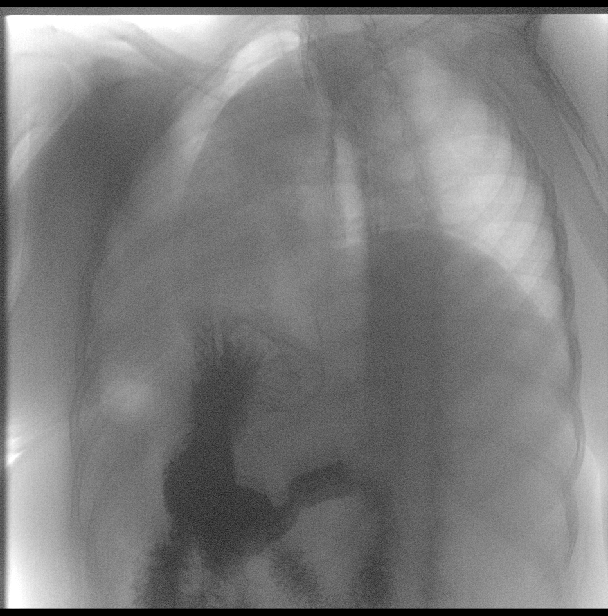

[Series 7: cp_standard · 1 of 179 frames shown (6 of 12)]
[frame 153/179]
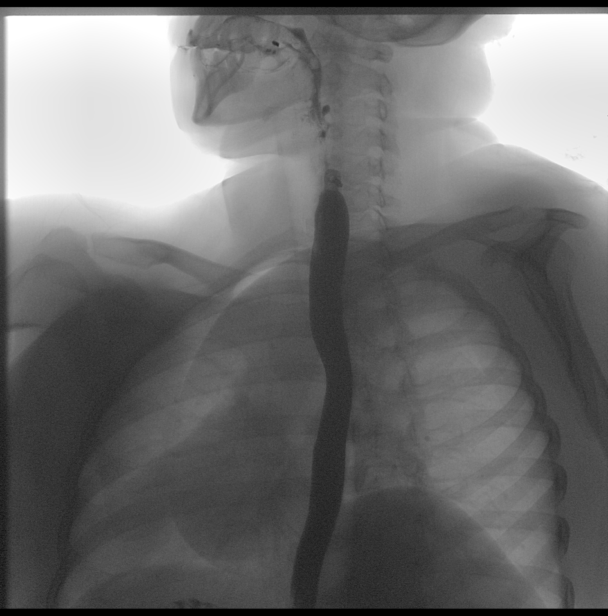

[Series 9: cp_standard · 2 of 161 frames shown (7 of 12)]
[frame 25/161]
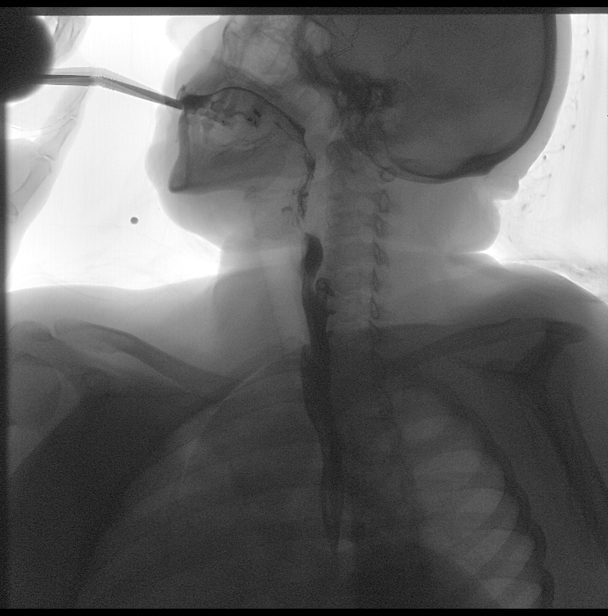
[frame 140/161]
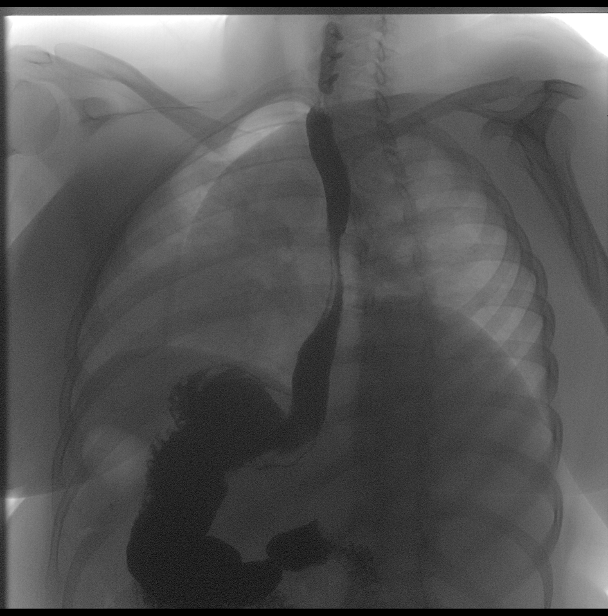

[Series 11: fluoro_barium 2fps_bw · 1 of 12 frames shown]
[frame 9/12]
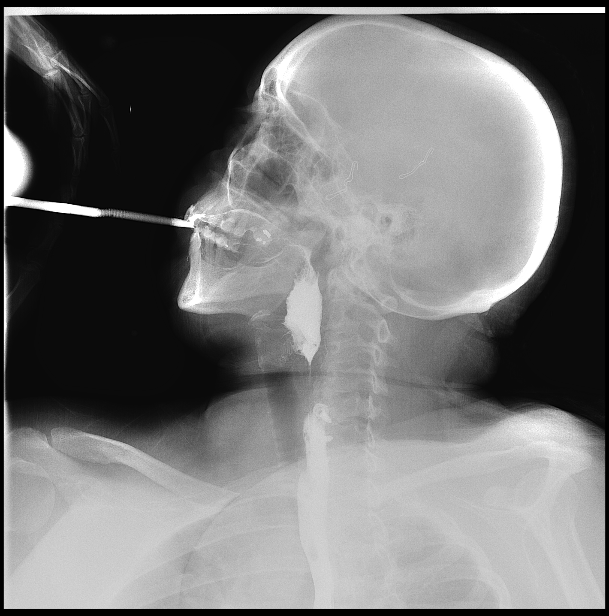

[Series 12: cp_standard · 1 of 10 frames shown (8 of 12)]
[frame 10/10]
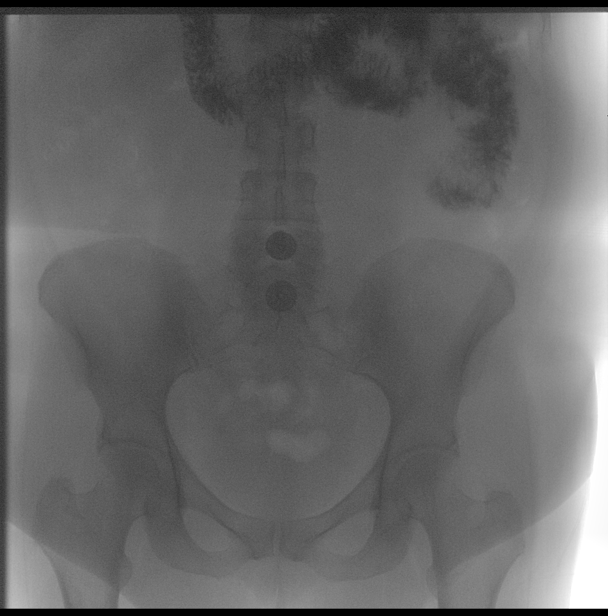

[Series 14: cp_standard · 1 of 97 frames shown (9 of 12)]
[frame 23/97]
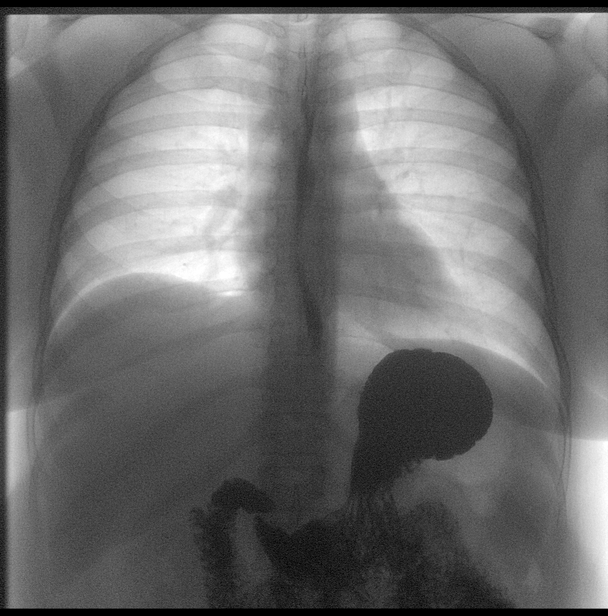

[Series 15: cp_standard · 1 of 75 frames shown (10 of 12)]
[frame 12/75]
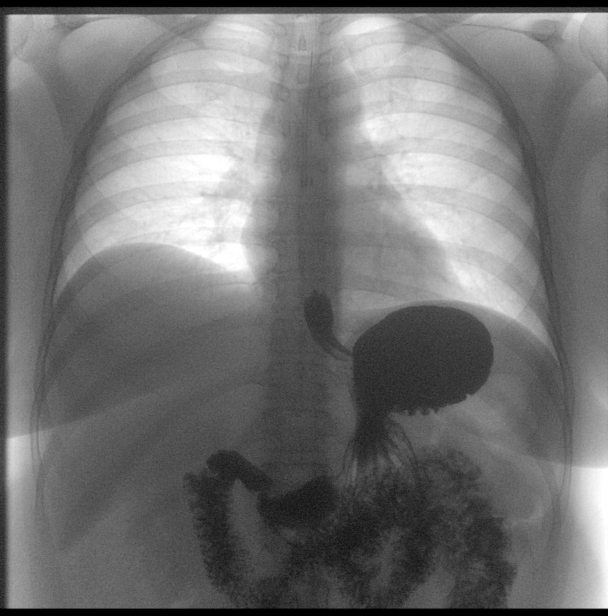

[Series 16: cp_standard · 1 of 10 frames shown (11 of 12)]
[frame 2/10]
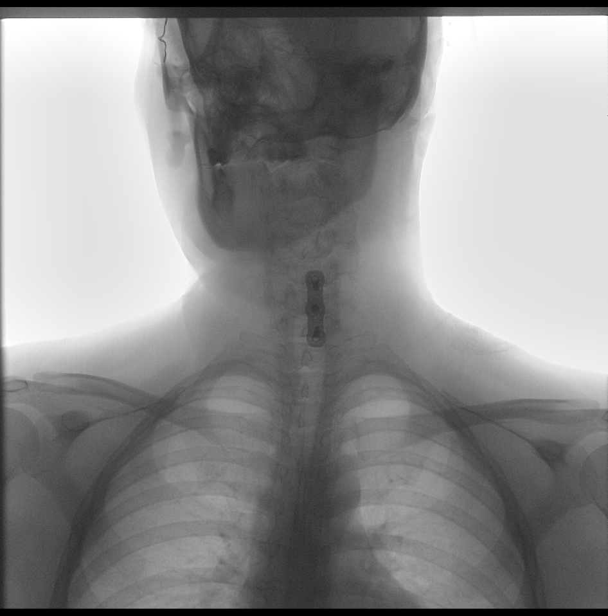

[Series 17: cp_standard · 1 of 165 frames shown (12 of 12)]
[frame 165/165  full-range]
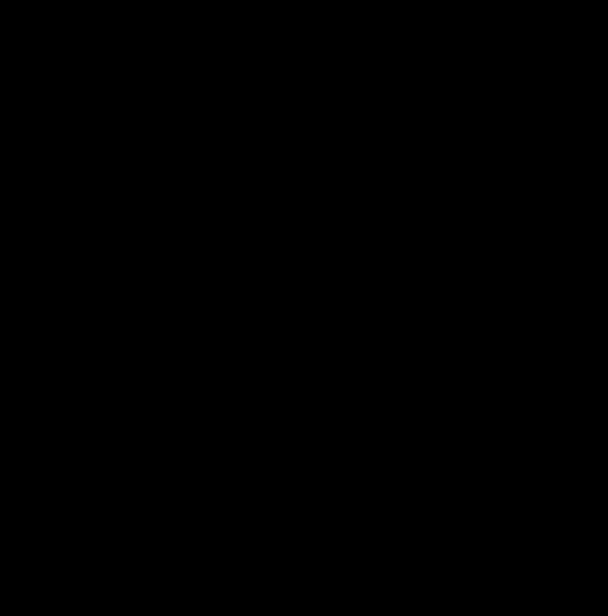

[14 of 24 positions shown; findings below may reference images not displayed]

FINDINGS: Swallowing: Appears normal. No vestibular penetration or aspiration
seen.

Pharynx: Unremarkable.

Esophagus: Normal appearance.

Esophageal motility: Within normal limits.

Hiatal Hernia: None.

Gastroesophageal reflux: None visualized.

Ingested 13mm barium tablet: Passed briskly into the stomach

Other: Note is made of a lower cervical plate and screw fixator.
IMPRESSION: 1. Normal esophagram.

ADDENDUM:
The original report was by Dr. LANGERS. The following
addendum is by Dr. LANGERS:

Please note that the final sentence under the technique section
should read:

This exam was performed by LANGERS, PA, and was supervised and
interpreted by LANGERS, M.D.

*** End of Addendum ***
FINDINGS: Swallowing: Appears normal. No vestibular penetration or aspiration
seen.

Pharynx: Unremarkable.

Esophagus: Normal appearance.

Esophageal motility: Within normal limits.

Hiatal Hernia: None.

Gastroesophageal reflux: None visualized.

Ingested 13mm barium tablet: Passed briskly into the stomach

Other: Note is made of a lower cervical plate and screw fixator.
IMPRESSION: 1. Normal esophagram.

## 2022-02-04 ENCOUNTER — Encounter: Payer: Self-pay | Admitting: Psychiatry

## 2022-02-04 ENCOUNTER — Other Ambulatory Visit: Payer: Self-pay

## 2022-02-04 ENCOUNTER — Ambulatory Visit (INDEPENDENT_AMBULATORY_CARE_PROVIDER_SITE_OTHER): Payer: Medicare HMO | Admitting: Psychiatry

## 2022-02-04 DIAGNOSIS — F319 Bipolar disorder, unspecified: Secondary | ICD-10-CM | POA: Diagnosis not present

## 2022-02-04 DIAGNOSIS — F4310 Post-traumatic stress disorder, unspecified: Secondary | ICD-10-CM | POA: Diagnosis not present

## 2022-02-04 DIAGNOSIS — G4700 Insomnia, unspecified: Secondary | ICD-10-CM

## 2022-02-04 MED ORDER — BUPROPION HCL ER (XL) 300 MG PO TB24: ORAL_TABLET | ORAL | 0 refills | Status: DC

## 2022-02-04 MED ORDER — ZOLPIDEM TARTRATE 10 MG PO TABS: ORAL_TABLET | ORAL | 2 refills | Status: DC

## 2022-02-04 MED ORDER — BREXPIPRAZOLE 2 MG PO TABS: 2.0000 mg | ORAL_TABLET | Freq: Every day | ORAL | 0 refills | Status: DC

## 2022-02-04 MED ORDER — BUSPIRONE HCL 15 MG PO TABS: 15.0000 mg | ORAL_TABLET | Freq: Two times a day (BID) | ORAL | 0 refills | Status: DC

## 2022-02-04 MED ORDER — PREGABALIN 200 MG PO CAPS: 200.0000 mg | ORAL_CAPSULE | Freq: Three times a day (TID) | ORAL | 2 refills | Status: DC

## 2022-02-04 NOTE — Progress Notes (Signed)
Julie Abbott ?[id number] ?Dec 25, 1975 ?46 y.o. ? ?Subjective:  ? ?Patient ID:  Julie Abbott is a 46 y.o. (DOB Jul 01, 1976) female. ? ?Chief Complaint:  ?Chief Complaint  ?Patient presents with  ? Sleeping Problem  ? ? ?HPI ?Pheonix Abbott presents to the office today for follow-up of Bipolar D/O, anxiety, ADHD, and insomnia. She reports poor sleep. Occasionally having difficulty falling asleep and other nights having difficulty staying asleep. Sleeping 4-5 hours a night. Husband has told her she is doing things in her sleep that she does not recall. She reports that this has started after change in pain medication. She reports weight gain and this has exacerbated her pain. She reports that she has had depression for 6 months. Denies any recent manic s/s. She reports "my anxiety is always flared." She reports anxiety around people that she does not know. She reports low energy and motivation. She reports poor memory. Difficulty with concentration. Denies SI.  ? ?Home is heated by wood stove and reports that this requires effort to maintain. They are also trying to garden.  ? ?She reports that she and her daughter have not spoken in a few weeks. ? ?Past Psychiatric Medication Trials: ?Ritalin- Prescribed when she was 46 yo. ?Adderall- was taking before pain medication. Would have "crash" in the afternoon. ?Latuda- Was taking in [place] at 80 mg BID. Reports that Latuda seemed to decrease frequency of manic s/s. Depression was less. Denies side effects ?Vraylar- Ineffective ?Abilify ?Risperdal ?Geodon ?Seroquel- Excessive somnolence ?Lamictal- Tolerated ok. Had some arthralgias. ?Depakote ?Trileptal ?Wellbutrin- Has taken long-term. Was increased to 300 mg po qd in the last 1-2 years. Denies side effects. Helps with decreasing smoking. ?Citalopram ?Cymbalta ?Buspar- Started by PCP in [place] after major stressor before moving to [place]. Thinks it is somewhat helpful for anxiety and rumination. Denies side effective. ?Ambien- Most  effective for sleep. Causes some impulsivity. Has taken for about 10 years ?Ambien CR ?Lunesta- ineffective. ?Melatonin ?Hydroxyzine ?Lyrica- Prescribed for pain ?Gabapentin-Adverse reaction ?Belsomra ?Trazodone- Adverse/paradoxical response ? ? ?AIMS   ? ?[facility] Office Visit from 10/02/2021 in [facility] Visit from 10/30/2020 in [facility] Visit from 04/10/2020 in [facility]  ?AIMS Total Score _0 ? ?  ?  ? ?Review of Systems:  ?Review of Systems  ?Musculoskeletal:  Positive for arthralgias and myalgias. Negative for gait problem.  ?Psychiatric/Behavioral:    ?     Please refer to HPI  ? ?Medications: I have reviewed the patient's current medications. ? ?Current Outpatient Medications  ?Medication Sig Dispense Refill  ? brexpiprazole (REXULTI) 2 MG TABS tablet Take 1 tablet (2 mg total) by mouth daily. 90 tablet 0  ? buprenorphine (SUBUTEX) 8 MG SUBL SL tablet Place 8 mg under the tongue 2 (two) times daily.    ? buPROPion (WELLBUTRIN XL) 300 MG 24 hr tablet TAKE 1 TABLET BY MOUTH ONCE DAILY IN THE MORNING 90 tablet 0  ? busPIRone (BUSPAR) 15 MG tablet Take 1 tablet (15 mg total) by mouth 2 (two) times daily. 180 tablet 0  ? cyclobenzaprine (FLEXERIL) 10 MG tablet 2 (two) times a day as needed.    ? estradiol (CLIMARA) 0.06 MG/24HR 1 patch once a week.    ? hydrOXYzine (ATARAX) 25 MG tablet Take 1 tablet (25 mg total) by mouth at bedtime. 90 tablet 1  ? losartan (COZAAR) 25 MG tablet Take by mouth.    ? [START ON 02/14/2022] pregabalin (LYRICA) 200 MG capsule  Take 1 capsule (200 mg total) by mouth 3 (three) times daily. 90 capsule 2  ? rosuvastatin (CRESTOR) 10 MG tablet Take by mouth.    ? [START ON 02/25/2022] zolpidem (AMBIEN) 10 MG tablet Take 1/2-1 tablet at bedtime 30 tablet 2  ? ?No current facility-administered medications for this visit.  ? ? ?Medication Side Effects: None ? ?Allergies:  ?Allergies  ?Allergen Reactions  ? Bee Venom    ? Gabapentin   ? Lortab [Hydrocodone-Acetaminophen]   ? ? ?Past Medical History:  ?Diagnosis Date  ? Back pain   ? Diabetes mellitus, type II ([place])   ? Interstitial cystitis   ? PCOS (polycystic ovarian syndrome)   ? ? ?Past Medical History, Surgical history, Social history, and Family history were reviewed and updated as appropriate.  ? ?Please see review of systems for further details on the patient's review from today.  ? ?Objective:  ? ?Physical Exam:  ?There were no vitals taken for this visit. ? ?Physical Exam ?Constitutional:   ?   General: She is not in acute distress. ?Musculoskeletal:     ?   General: No deformity.  ?Neurological:  ?   Mental Status: She is alert and oriented to person, place, and time.  ?   Coordination: Coordination normal.  ?Psychiatric:     ?   Attention and Perception: Attention and perception normal. She does not perceive auditory or visual hallucinations.     ?   Mood and Affect: Mood is anxious. Affect is not labile, blunt, angry or inappropriate.     ?   Speech: Speech normal.     ?   Behavior: Behavior normal.     ?   Thought Content: Thought content normal. Thought content is not paranoid or delusional. Thought content does not include homicidal or suicidal ideation. Thought content does not include homicidal or suicidal plan.     ?   Cognition and Memory: Cognition and memory normal.     ?   Judgment: Judgment normal.  ?   Comments: Insight intact ?Dysthymic mood  ? ? ?Lab Review:  ?No results found for: NA, K, CL, CO2, GLUCOSE, BUN, CREATININE, CALCIUM, PROT, ALBUMIN, AST, ALT, ALKPHOS, BILITOT, GFRNONAA, GFRAA ? ?No results found for: WBC, RBC, HGB, HCT, PLT, MCV, MCH, MCHC, RDW, LYMPHSABS, MONOABS, EOSABS, BASOSABS ? ?No results found for: POCLITH, LITHIUM  ? ?No results found for: PHENYTOIN, PHENOBARB, VALPROATE, CBMZ  ? ?.res ?Assessment: Plan:   ?Pt seen for 30 minutes and time spent reviewing treatment plan and discussing how recent psychosocial stressors have affected  her mood and anxiety. She reports that she has not experienced parasomnias with Ambien in the past until she started Subutex. She reports other concerns with Subutex and plans to discuss these with pain management provider at next visit. She reports that she has tried and failed multiple past trials of other medications for insomnia and wishes to continue Ambien at this time. Will re-evaluate at next visit after she has met with pain specialist.  ?Continue Rexulti 2 mg po qd for mood s/s.  ?Continue Wellbutrin XL 300 mg po qd for depression.  ?Continue Buspar 15 mg po BID for anxiety.  ?Continue Lyrica 200 mg po TID for anxiety. ?Continue Hydroxyzine 25 mg po QHS for insomnia and anxiety.  ?Continue Ambien 10 mg 1/2-1 tab po QHS prn insomnia.  ?Pt to follow-up in 2 months or sooner if clinically indicated.  ?Patient advised to contact office with any questions, adverse effects,  or acute worsening in signs and symptoms. ? ? ? ?Virtie was seen today for sleeping problem. ? ?Diagnoses and all orders for this visit: ? ?Bipolar affective disorder, remission status unspecified (HCC) ?-     brexpiprazole (REXULTI) 2 MG TABS tablet; Take 1 tablet (2 mg total) by mouth daily. ?-     buPROPion (WELLBUTRIN XL) 300 MG 24 hr tablet; TAKE 1 TABLET BY MOUTH ONCE DAILY IN THE MORNING ?-     pregabalin (LYRICA) 200 MG capsule; Take 1 capsule (200 mg total) by mouth 3 (three) times daily. ? ?PTSD (post-traumatic stress disorder) ?-     brexpiprazole (REXULTI) 2 MG TABS tablet; Take 1 tablet (2 mg total) by mouth daily. ?-     busPIRone (BUSPAR) 15 MG tablet; Take 1 tablet (15 mg total) by mouth 2 (two) times daily. ?-     pregabalin (LYRICA) 200 MG capsule; Take 1 capsule (200 mg total) by mouth 3 (three) times daily. ? ?Insomnia, unspecified type ?-     zolpidem (AMBIEN) 10 MG tablet; Take 1/2-1 tablet at bedtime ? ?  ? ?Please see After Visit Summary for patient specific instructions. ? ?Future Appointments  ?Date Time Provider  [place]  ?04/05/2022 10:30 AM Thayer Headings, PMHNP CP-CP None  ? ? ?No orders of the defined types were placed in this encounter. ? ? ?------------------------------- ?

## 2022-04-02 ENCOUNTER — Ambulatory Visit: Payer: Medicare HMO | Admitting: Orthopaedic Surgery

## 2022-04-05 ENCOUNTER — Ambulatory Visit: Payer: Medicare HMO | Admitting: Psychiatry

## 2022-04-05 ENCOUNTER — Encounter: Payer: Self-pay | Admitting: Psychiatry

## 2022-04-05 DIAGNOSIS — G4700 Insomnia, unspecified: Secondary | ICD-10-CM

## 2022-04-05 DIAGNOSIS — F4310 Post-traumatic stress disorder, unspecified: Secondary | ICD-10-CM | POA: Diagnosis not present

## 2022-04-05 DIAGNOSIS — F319 Bipolar disorder, unspecified: Secondary | ICD-10-CM

## 2022-04-05 MED ORDER — ZOLPIDEM TARTRATE 10 MG PO TABS: ORAL_TABLET | ORAL | 2 refills | Status: DC

## 2022-04-05 MED ORDER — PREGABALIN 200 MG PO CAPS: 200.0000 mg | ORAL_CAPSULE | Freq: Three times a day (TID) | ORAL | 2 refills | Status: DC

## 2022-04-05 MED ORDER — BUPROPION HCL ER (XL) 300 MG PO TB24: ORAL_TABLET | ORAL | 0 refills | Status: DC

## 2022-04-05 MED ORDER — BUSPIRONE HCL 15 MG PO TABS: 15.0000 mg | ORAL_TABLET | Freq: Two times a day (BID) | ORAL | 0 refills | Status: DC

## 2022-04-05 MED ORDER — BREXPIPRAZOLE 2 MG PO TABS: 2.0000 mg | ORAL_TABLET | Freq: Every day | ORAL | 0 refills | Status: DC

## 2022-04-05 NOTE — Progress Notes (Signed)
Beckett Tazewell ?[id number] ?12-08-1975 ?46 y.o. ? ?Subjective:  ? ?Patient ID:  Julie Abbott is a 46 y.o. (DOB 01-19-1976) female. ? ?Chief Complaint:  ?Chief Complaint  ?Patient presents with  ? Depression  ? Anxiety  ? ? ?Depression ?       Past medical history includes anxiety.   ?Anxiety ? ? ? ?Julie Abbott presents to the office today for follow-up of mood disturbance, anxiety, insomnia, and ADD. She reports that her mood has been low. Denies feeling any happiness. She reports periods of increased irritability. She reports irritability when she does not have adequate personal space/privacy. Energy and motivation have been low. She tries to accomplish certain chores on certain days and this is a compulsion and she will feel anxious if her schedule and rituals are thrown off. She reports that she was not sleeping well. She reports that her husband told her she stops breathing periodically in her sleep. She reports that she will snore. She reports that she used to use a cPap in the past and had difficulty tolerating this. She plans to talk with her PCP about other treatment options for sleep apnea.  ? ?She reports that she stopped taking her pain medication about a month ago since it did not seem to be helpful and was causing weight gain due to her eating at night in her sleep and also difficulty with concentration. She reports that she is no longer eating or going outside in her sleep. She reports that she continues to have difficulty with concentration. She reports anxiety about health issues and getting medical care. She reports that she tries to conceal her mental health issues from husband and this causes her anxiety. She reports anxiety in crowds and typically uses pick-up service instead of going in stores to shop. Denies panic attacks. Denies SI.  ? ?She reports some emotional eating during the day.  ? ?She has not talked much to her daughter recently and learned yesterday that daughter moved to [place]  with pt's youngest granddaughter. Continues to grieve oldest granddaughter after her daughter no longer has parental rights. She reports that her mother has been having difficulties and chose not to come to [facility] with her.  ? ? ?Past Psychiatric Medication Trials: ?Ritalin- Prescribed when she was 46 yo. ?Adderall- was taking before pain medication. Would have "crash" in the afternoon. ?Latuda- Was taking in [place] at 80 mg BID. Reports that Latuda seemed to decrease frequency of manic s/s. Depression was less. Denies side effects ?Vraylar- Ineffective ?Abilify ?Risperdal ?Geodon ?Seroquel- Excessive somnolence ?Lamictal- Tolerated ok. Had some arthralgias. ?Depakote ?Trileptal ?Wellbutrin- Has taken long-term. Was increased to 300 mg po qd in the last 1-2 years. Denies side effects. Helps with decreasing smoking. ?Citalopram ?Cymbalta ?Buspar- Started by PCP in [place] after major stressor before moving to [place]. Thinks it is somewhat helpful for anxiety and rumination. Denies side effective. ?Ambien- Most effective for sleep.  Has taken for about 10-15 years ?Ambien CR ?Lunesta- ineffective. ?Melatonin ?Hydroxyzine ?Lyrica- Prescribed for pain, neuropathy, and mood stabilization.  ?Gabapentin-Adverse reaction ?Belsomra ?Trazodone- Adverse/paradoxical response ? ? ?AIMS   ? ?Flowsheet Row Office Visit from 10/02/2021 in [facility] Office Visit from 10/30/2020 in [facility] Office Visit from 04/10/2020 in [facility]  ?AIMS Total Score 3 3 3   ? ?  ?  ? ?Review of Systems:  ?Review of Systems  ?Musculoskeletal:  Positive for arthralgias and back pain. Negative for gait problem.  ?Neurological:  Positive for weakness.  ?  Neuropathy  ?Psychiatric/Behavioral:  Positive for depression.   ?     Please refer to HPI  ? ?She reports that she is looking for a new pain management specialist. ? ?Medications: I have reviewed the patient's current medications. ? ?Current Outpatient  Medications  ?Medication Sig Dispense Refill  ? cyclobenzaprine (FLEXERIL) 10 MG tablet 2 (two) times a day as needed.    ? estradiol (CLIMARA) 0.06 MG/24HR 1 patch once a week.    ? losartan (COZAAR) 25 MG tablet Take by mouth.    ? rosuvastatin (CRESTOR) 10 MG tablet Take by mouth.    ? brexpiprazole (REXULTI) 2 MG TABS tablet Take 1 tablet (2 mg total) by mouth daily. 90 tablet 0  ? buprenorphine (SUBUTEX) 8 MG SUBL SL tablet Place 8 mg under the tongue 2 (two) times daily. (Patient not taking: Reported on 04/05/2022)    ? buPROPion (WELLBUTRIN XL) 300 MG 24 hr tablet TAKE 1 TABLET BY MOUTH ONCE DAILY IN THE MORNING 90 tablet 0  ? busPIRone (BUSPAR) 15 MG tablet Take 1 tablet (15 mg total) by mouth 2 (two) times daily. 180 tablet 0  ? hydrOXYzine (ATARAX) 25 MG tablet Take 1 tablet (25 mg total) by mouth at bedtime. 90 tablet 1  ? [START ON 05/13/2022] pregabalin (LYRICA) 200 MG capsule Take 1 capsule (200 mg total) by mouth 3 (three) times daily. 90 capsule 2  ? [START ON 05/06/2022] zolpidem (AMBIEN) 10 MG tablet Take 1/2-1 tablet at bedtime 30 tablet 2  ? ?No current facility-administered medications for this visit.  ? ? ?Medication Side Effects: None ? ?Allergies:  ?Allergies  ?Allergen Reactions  ? Bee Venom   ? Gabapentin   ? Lortab [Hydrocodone-Acetaminophen]   ? ? ?Past Medical History:  ?Diagnosis Date  ? Back pain   ? Diabetes mellitus, type II (HCC)   ? Interstitial cystitis   ? PCOS (polycystic ovarian syndrome)   ? ? ?Past Medical History, Surgical history, Social history, and Family history were reviewed and updated as appropriate.  ? ?Please see review of systems for further details on the patient's review from today.  ? ?Objective:  ? ?Physical Exam:  ?BP 119/68   Pulse 89  ? ?Physical Exam ?Constitutional:   ?   General: She is not in acute distress. ?Musculoskeletal:     ?   General: No deformity.  ?Neurological:  ?   Mental Status: She is alert and oriented to person, place, and time.  ?    Coordination: Coordination normal.  ?Psychiatric:     ?   Attention and Perception: Attention and perception normal. She does not perceive auditory or visual hallucinations.     ?   Mood and Affect: Mood is anxious and depressed. Affect is not labile, blunt, angry or inappropriate.     ?   Speech: Speech normal.     ?   Behavior: Behavior normal.     ?   Thought Content: Thought content normal. Thought content is not paranoid or delusional. Thought content does not include homicidal or suicidal ideation. Thought content does not include homicidal or suicidal plan.     ?   Cognition and Memory: Cognition and memory normal.     ?   Judgment: Judgment normal.  ?   Comments: Insight intact  ? ? ?Lab Review:  ?No results found for: NA, K, CL, CO2, GLUCOSE, BUN, CREATININE, CALCIUM, PROT, ALBUMIN, AST, ALT, ALKPHOS, BILITOT, GFRNONAA, GFRAA ? ?No results found for: WBC,  RBC, HGB, HCT, PLT, MCV, MCH, MCHC, RDW, LYMPHSABS, MONOABS, EOSABS, BASOSABS ? ?No results found for: POCLITH, LITHIUM  ? ?No results found for: PHENYTOIN, PHENOBARB, VALPROATE, CBMZ  ? ?.res ?Assessment: Plan:   ?Pt seen for 30 minutes and time spent discussing response to recently stopping Subutex. She reports that she is no longer experiencing parasomnias. She reports that she would like to continue current psychiatric medications without changes at this time since she reports that medications have been helpful for symptoms overall and has not experienced any recent severe manic symptoms/sleeplessness.  ?Continue Rexulti 2 mg po qd for mood symptoms.  ?Continue Wellbutrin XL 300 mg po qd for depression.  ?Continue Lyrica 200 mg three times daily for mood and anxiety.  ?Continue Ambien 10 mg 1/2-1 tab at bedtime for insomnia.  ?Continue Buspar 15 mg po BID for anxiety.  ?Pt to follow-up in 2 months or sooner if clinically indicated.  ?Patient advised to contact office with any questions, adverse effects, or acute worsening in signs and  symptoms. ? ? ?Julie Abbott was seen today for depression and anxiety. ? ?Diagnoses and all orders for this visit: ? ?Bipolar affective disorder, remission status unspecified (HCC) ?-     brexpiprazole (REXULTI) 2 MG TABS tablet; Take 1 table

## 2022-04-10 DIAGNOSIS — Z981 Arthrodesis status: Secondary | ICD-10-CM

## 2022-04-10 HISTORY — DX: Arthrodesis status: Z98.1

## 2022-06-05 ENCOUNTER — Other Ambulatory Visit: Payer: Self-pay | Admitting: Psychiatry

## 2022-06-05 DIAGNOSIS — F411 Generalized anxiety disorder: Secondary | ICD-10-CM

## 2022-06-05 DIAGNOSIS — G4700 Insomnia, unspecified: Secondary | ICD-10-CM

## 2022-06-12 ENCOUNTER — Encounter: Payer: Self-pay | Admitting: Psychiatry

## 2022-06-12 ENCOUNTER — Ambulatory Visit (INDEPENDENT_AMBULATORY_CARE_PROVIDER_SITE_OTHER): Payer: Medicare HMO | Admitting: Psychiatry

## 2022-06-12 VITALS — BP 105/59 | HR 94

## 2022-06-12 DIAGNOSIS — F902 Attention-deficit hyperactivity disorder, combined type: Secondary | ICD-10-CM

## 2022-06-12 DIAGNOSIS — F4310 Post-traumatic stress disorder, unspecified: Secondary | ICD-10-CM | POA: Diagnosis not present

## 2022-06-12 DIAGNOSIS — F319 Bipolar disorder, unspecified: Secondary | ICD-10-CM

## 2022-06-12 MED ORDER — BUSPIRONE HCL 15 MG PO TABS: 15.0000 mg | ORAL_TABLET | Freq: Two times a day (BID) | ORAL | 0 refills | Status: DC

## 2022-06-12 MED ORDER — BREXPIPRAZOLE 2 MG PO TABS: 2.0000 mg | ORAL_TABLET | Freq: Every day | ORAL | 0 refills | Status: DC

## 2022-06-12 MED ORDER — AMPHETAMINE-DEXTROAMPHET ER 20 MG PO CP24: 20.0000 mg | ORAL_CAPSULE | Freq: Every day | ORAL | 0 refills | Status: DC

## 2022-06-12 MED ORDER — BUPROPION HCL ER (XL) 300 MG PO TB24: ORAL_TABLET | ORAL | 0 refills | Status: DC

## 2022-06-12 NOTE — Progress Notes (Signed)
Julie Abbott [id number] [birth year] 46 y.o.  Subjective:   Patient ID:  Julie Abbott is a 46 y.o. (DOB [birth year]) female.  Chief Complaint:  Chief Complaint  Patient presents with   ADHD   Follow-up    Mood disturbance, anxiety, insomnia    HPI Julie Abbott presents to the office today for follow-up of mood disturbance, anxiety, insomnia, and ADD.   She reports, "I'm not resting well." She reports that she has not been sleeping well. She reports that she has not taken pain medication for the last 2 months. She reports that she had severe n/v with Subutex. She has not seen pain management recently. She reports, "maybe that is why I am not sleeping well."  She reports, "I am trying to elevate myself." She reports that she is trying to take steps to improve mood and feels that depression is starting to improve. She reports that she is thinking about what she can control and what she can't, and trying to worry less about things she cannot control such as her family. She reports that she is contemplating trying to return to work. She reports that she thinks she may need to re-start Adderall in order to study for licensure exam. She reports poor concentration and difficulty completing tasks. Some difficulty with word finding. Energy and motivation have been good. She reports some slight increase in goal-directed activity. Denies impulsive or risky behavior. Reports mood is most stable when she follows a set routine. Denies SI.   Daughter has moved to the [employer]. She reports financial stressors. Mother has COPD and stage III lung cancer. Mother is receiving hospice services.   Buprenorphine was last filled 03/21/22. Ambien last filled 6/19. Lyrica last filled 05/15/22.  Past Psychiatric Medication Trials: Ritalin- Prescribed when she was 46 yo. Felt like a zombie.  Adderall- was taking before pain medication. Would have "crash" in the afternoon. Latuda- Was taking in [place] at 80 mg BID.  Reports that Latuda seemed to decrease frequency of manic s/s. Depression was less. Denies side effects Vraylar- Ineffective Abilify Risperdal Geodon Seroquel- Excessive somnolence Lamictal- Tolerated ok. Had some arthralgias. Depakote Trileptal Wellbutrin- Has taken long-term. Was increased to 300 mg po qd in the last 1-2 years. Denies side effects. Helps with decreasing smoking. Citalopram Cymbalta Buspar- Started by PCP in [place] after major stressor before moving to . Thinks it is somewhat helpful for anxiety and rumination. Denies side effective. Ambien- Most effective for sleep.  Has taken for about 10-15 years Ambien CR Lunesta- ineffective. Melatonin Hydroxyzine Lyrica- Prescribed for pain, neuropathy, and mood stabilization.  Gabapentin-Adverse reaction Belsomra Trazodone- Adverse/paradoxical response  AIMS    Flowsheet Row Office Visit from 06/12/2022 in [facility] Office Visit from 10/02/2021 in [facility] Office Visit from 10/30/2020 in [facility] Office Visit from 04/10/2020 in [facility]  AIMS Total Score 7 3 3 3         Review of Systems:  Review of Systems  Musculoskeletal:  Negative for gait problem.  Neurological:  Positive for tremors.  Psychiatric/Behavioral:         Please refer to HPI    Medications: I have reviewed the patient's current medications.  Current Outpatient Medications  Medication Sig Dispense Refill   amphetamine-dextroamphetamine (ADDERALL XR) 20 MG 24 hr capsule Take 1 capsule (20 mg total) by mouth daily. 30 capsule 0   brexpiprazole (REXULTI) 2 MG TABS tablet Take 1 tablet (2 mg total) by mouth daily. 90 tablet 0  buPROPion (WELLBUTRIN XL) 300 MG 24 hr tablet TAKE 1 TABLET BY MOUTH ONCE DAILY IN THE MORNING 90 tablet 0   busPIRone (BUSPAR) 15 MG tablet Take 1 tablet (15 mg total) by mouth 2 (two) times daily. 180 tablet 0   cyclobenzaprine (FLEXERIL) 10 MG tablet 2  (two) times a day as needed.     estradiol (CLIMARA) 0.06 MG/24HR 1 patch once a week.     hydrOXYzine (ATARAX) 25 MG tablet TAKE 1 TABLET BY MOUTH EVERYDAY AT BEDTIME 90 tablet 0   losartan (COZAAR) 25 MG tablet Take by mouth.     pregabalin (LYRICA) 200 MG capsule Take 1 capsule (200 mg total) by mouth 3 (three) times daily. 90 capsule 2   rosuvastatin (CRESTOR) 10 MG tablet Take by mouth.     zolpidem (AMBIEN) 10 MG tablet Take 1/2-1 tablet at bedtime 30 tablet 2   No current facility-administered medications for this visit.    Medication Side Effects: None  Allergies:  Allergies  Allergen Reactions   Bee Venom    Gabapentin    Lortab [Hydrocodone-Acetaminophen]     Past Medical History:  Diagnosis Date   Back pain    Diabetes mellitus, type II (HCC)    Interstitial cystitis    PCOS (polycystic ovarian syndrome)     Past Medical History, Surgical history, Social history, and Family history were reviewed and updated as appropriate.   Please see review of systems for further details on the patient's review from today.   Objective:   Physical Exam:  BP (!) 105/59   Pulse 94   Physical Exam Constitutional:      General: She is not in acute distress. Musculoskeletal:        General: No deformity.  Neurological:     Mental Status: She is alert and oriented to person, place, and time.     Coordination: Coordination normal.  Psychiatric:        Attention and Perception: Attention and perception normal. She does not perceive auditory or visual hallucinations.        Mood and Affect: Mood is anxious. Affect is not labile, blunt, angry or inappropriate.        Speech: Speech normal.        Behavior: Behavior normal.        Thought Content: Thought content normal. Thought content is not paranoid or delusional. Thought content does not include homicidal or suicidal ideation. Thought content does not include homicidal or suicidal plan.        Cognition and Memory: Cognition  and memory normal.        Judgment: Judgment normal.     Comments: Insight intact Mildly depressed     Lab Review:  No results found for: "NA", "K", "CL", "CO2", "GLUCOSE", "BUN", "CREATININE", "CALCIUM", "PROT", "ALBUMIN", "AST", "ALT", "ALKPHOS", "BILITOT", "GFRNONAA", "GFRAA"  No results found for: "WBC", "RBC", "HGB", "HCT", "PLT", "MCV", "MCH", "MCHC", "RDW", "LYMPHSABS", "MONOABS", "EOSABS", "BASOSABS"  No results found for: "POCLITH", "LITHIUM"   No results found for: "PHENYTOIN", "PHENOBARB", "VALPROATE", "CBMZ"   .res Assessment: Plan:   Will re-start Adderall XR 20 mg po qd for ADHD since she is no longer on opiate pain management and she would like to prepare to possibly try to return to work.  Continue Rexulti 2 mg po qd for mood symptoms. Continue Wellbutrin XL 300 mg daily for depression. Continue BuSpar 15 mg twice daily for anxiety. Continue hydroxyzine 25 mg at bedtime for insomnia. Continue Lyrica 200  mg 3 times daily for anxiety. Continue Ambien 10 mg at bedtime for insomnia. Pt to follow-up with this provider in 4 weeks or sooner if clinically indicated.  Patient advised to contact office with any questions, adverse effects, or acute worsening in signs and symptoms.  Julie Abbott was seen today for adhd and follow-up.  Diagnoses and all orders for this visit:  Attention deficit hyperactivity disorder (ADHD), combined type -     amphetamine-dextroamphetamine (ADDERALL XR) 20 MG 24 hr capsule; Take 1 capsule (20 mg total) by mouth daily.  Bipolar affective disorder, remission status unspecified (HCC) -     brexpiprazole (REXULTI) 2 MG TABS tablet; Take 1 tablet (2 mg total) by mouth daily. -     buPROPion (WELLBUTRIN XL) 300 MG 24 hr tablet; TAKE 1 TABLET BY MOUTH ONCE DAILY IN THE MORNING  PTSD (post-traumatic stress disorder) -     brexpiprazole (REXULTI) 2 MG TABS tablet; Take 1 tablet (2 mg total) by mouth daily. -     busPIRone (BUSPAR) 15 MG tablet; Take 1  tablet (15 mg total) by mouth 2 (two) times daily.     Please see After Visit Summary for patient specific instructions.  Future Appointments  Date Time Provider Department Center  07/10/2022  9:30 AM Corie Chiquito, PMHNP [facility] None     No orders of the defined types were placed in this encounter.   -------------------------------

## 2022-07-10 ENCOUNTER — Ambulatory Visit: Payer: Medicare HMO | Admitting: Psychiatry

## 2022-07-10 ENCOUNTER — Encounter: Payer: Self-pay | Admitting: Psychiatry

## 2022-07-10 DIAGNOSIS — F319 Bipolar disorder, unspecified: Secondary | ICD-10-CM | POA: Diagnosis not present

## 2022-07-10 DIAGNOSIS — F902 Attention-deficit hyperactivity disorder, combined type: Secondary | ICD-10-CM

## 2022-07-10 DIAGNOSIS — G4700 Insomnia, unspecified: Secondary | ICD-10-CM

## 2022-07-10 DIAGNOSIS — F4310 Post-traumatic stress disorder, unspecified: Secondary | ICD-10-CM

## 2022-07-10 MED ORDER — ZOLPIDEM TARTRATE 10 MG PO TABS: ORAL_TABLET | ORAL | 2 refills | Status: DC

## 2022-07-10 MED ORDER — PREGABALIN 200 MG PO CAPS: 200.0000 mg | ORAL_CAPSULE | Freq: Three times a day (TID) | ORAL | 2 refills | Status: DC

## 2022-07-10 NOTE — Progress Notes (Signed)
Julie Abbott [id number] January 25, 1976 46 y.o.  Subjective:   Patient ID:  Julie Abbott is a 46 y.o. (DOB 1976/07/05) female.  Chief Complaint:  Chief Complaint  Patient presents with   Depression   Anxiety   ADHD    HPI Julie Abbott presents to the office today for follow-up of Bipolar Disorder, anxiety, insomnia, and ADHD.   She reports that her insurance is not covering Adderall XR. She paid cash for it.  Her mother died this last 2023/03/30 and she was made aware of her death last 03/25/2023 when the police knocked on her door to notify her. She then went to [place] to clear out her mother's apartment. Reports that mother "died under suspicious circumstances." Reports that mother's medications were missing and her purse was cleaned out. She has been grieving the loss of her mother and has been asking herself if she "could have done more" in the past to "have changed the trajectory of things." She has had some rumination related to mother's death. She and her mother had a complicated relationship. Has had periods of crying. She has been somewhat withdrawn and did not go to church last Sunday.   She reports that she has been irritable at times and has had to apologize to her husband "for being short with him." She reports obsessive thoughts and compulsions and has increased anxiety in response to husband's hoarding. She reports that her anxiety has been elevated. Noticed her anxiety was better when she took Adderall XR. Sleep has been decreased since mother's death. Increased appetite and emotional eating after mother's death.   Denies SI.   Past Psychiatric Medication Trials: Ritalin- Prescribed when she was 46 yo. Felt like a zombie.  Adderall- was taking before pain medication. Would have "crash" in the afternoon. Adderall XR Vyvanse- Ineffective Latuda- Was taking in [place] at 80 mg BID. Reports that Latuda seemed to decrease frequency of manic s/s. Depression was less. Denies side  effects Vraylar- Ineffective Abilify Risperdal Geodon Seroquel- Excessive somnolence Lamictal- Tolerated ok. Had some arthralgias. Depakote Trileptal Wellbutrin- Has taken long-term. Was increased to 300 mg po qd in the last 1-2 years. Denies side effects. Helps with decreasing smoking. Citalopram Cymbalta Buspar- Started by PCP in [place] after major stressor before moving to [place]. Thinks it is somewhat helpful for anxiety and rumination. Denies side effective. Ambien- Most effective for sleep.  Has taken for about 10-15 years Ambien CR Lunesta- ineffective. Melatonin Hydroxyzine Lyrica- Prescribed for pain, neuropathy, and mood stabilization.  Gabapentin-Adverse reaction Belsomra Trazodone- Adverse/paradoxical response  AIMS    Flowsheet Row Office Visit from 06/12/2022 in [facility] Office Visit from 10/02/2021 in [facility] Office Visit from 10/30/2020 in [facility] Office Visit from 04/10/2020 in [facility]  AIMS Total Score 7 3 3 3         Review of Systems:  Review of Systems  Gastrointestinal:  Positive for diarrhea.  Musculoskeletal:  Positive for back pain, myalgias and neck pain. Negative for gait problem.  Psychiatric/Behavioral:         Please refer to HPI    Medications: I have reviewed the patient's current medications.  Current Outpatient Medications  Medication Sig Dispense Refill   amphetamine-dextroamphetamine (ADDERALL XR) 20 MG 24 hr capsule Take 1 capsule (20 mg total) by mouth daily. 30 capsule 0   brexpiprazole (REXULTI) 2 MG TABS tablet Take 1 tablet (2 mg total) by mouth daily. 90 tablet 0   buPROPion (WELLBUTRIN XL) 300 MG  24 hr tablet TAKE 1 TABLET BY MOUTH ONCE DAILY IN THE MORNING 90 tablet 0   busPIRone (BUSPAR) 15 MG tablet Take 1 tablet (15 mg total) by mouth 2 (two) times daily. 180 tablet 0   cyclobenzaprine (FLEXERIL) 10 MG tablet 2 (two) times a day as needed.      estradiol (CLIMARA) 0.06 MG/24HR 1 patch once a week.     hydrOXYzine (ATARAX) 25 MG tablet TAKE 1 TABLET BY MOUTH EVERYDAY AT BEDTIME 90 tablet 0   losartan (COZAAR) 25 MG tablet Take by mouth.     [START ON 08/10/2022] pregabalin (LYRICA) 200 MG capsule Take 1 capsule (200 mg total) by mouth 3 (three) times daily. 90 capsule 2   rosuvastatin (CRESTOR) 10 MG tablet Take by mouth.     [START ON 08/15/2022] zolpidem (AMBIEN) 10 MG tablet Take 1/2-1 tablet at bedtime 30 tablet 2   No current facility-administered medications for this visit.    Medication Side Effects: Other: Possible diarrhea  Allergies:  Allergies  Allergen Reactions   Bee Venom    Gabapentin    Lortab [Hydrocodone-Acetaminophen]     Past Medical History:  Diagnosis Date   Back pain    Diabetes mellitus, type II (HCC)    Interstitial cystitis    PCOS (polycystic ovarian syndrome)     Past Medical History, Surgical history, Social history, and Family history were reviewed and updated as appropriate.   Please see review of systems for further details on the patient's review from today.   Objective:   Physical Exam:  BP (!) 145/91   Pulse 92   Physical Exam Constitutional:      General: She is not in acute distress. Musculoskeletal:        General: No deformity.  Neurological:     Mental Status: She is alert and oriented to person, place, and time.     Coordination: Coordination normal.  Psychiatric:        Attention and Perception: Attention and perception normal. She does not perceive auditory or visual hallucinations.        Mood and Affect: Mood is depressed. Mood is not anxious. Affect is tearful. Affect is not labile, blunt, angry or inappropriate.        Speech: Speech normal.        Behavior: Behavior normal.        Thought Content: Thought content normal. Thought content is not paranoid or delusional. Thought content does not include homicidal or suicidal ideation. Thought content does not include  homicidal or suicidal plan.        Cognition and Memory: Cognition and memory normal.        Judgment: Judgment normal.     Comments: Insight intact     Lab Review:  No results found for: "NA", "K", "CL", "CO2", "GLUCOSE", "BUN", "CREATININE", "CALCIUM", "PROT", "ALBUMIN", "AST", "ALT", "ALKPHOS", "BILITOT", "GFRNONAA", "GFRAA"  No results found for: "WBC", "RBC", "HGB", "HCT", "PLT", "MCV", "MCH", "MCHC", "RDW", "LYMPHSABS", "MONOABS", "EOSABS", "BASOSABS"  No results found for: "POCLITH", "LITHIUM"   No results found for: "PHENYTOIN", "PHENOBARB", "VALPROATE", "CBMZ"   .res Assessment: Plan:    Pt seen for 30 minutes and time spent discussing PA process for Adderall XR. Will request that office follow-up with pharmacy since PA request has not been received. Will continue Adderall DR 20 mg po qd for ADHD since she reports improved concentration, focus, and motivation with Adderall XR.  Continue Rexulti 2 mg po qd for mood s/s.  Continue Buspar 15 mg po BID for anxiety.  Continue Wellbutrin XL 300 mg po qd for depression.  Continue Hydroxyzine 25 mg po QHS for insomnia.  Continue Lyrica 200 mg TID for anxiety and mood. She reports that Lyrica has also been helpful for pain.  Continue Ambien 10 mg po QHS for insomnia.  Pt to follow-up with this provider in 4 weeks or sooner if clinically indicated.  Patient advised to contact office with any questions, adverse effects, or acute worsening in signs and symptoms.   Julie Abbott was seen today for depression, anxiety and adhd.  Diagnoses and all orders for this visit:  Bipolar affective disorder, remission status unspecified (HCC) -     pregabalin (LYRICA) 200 MG capsule; Take 1 capsule (200 mg total) by mouth 3 (three) times daily.  Insomnia, unspecified type -     zolpidem (AMBIEN) 10 MG tablet; Take 1/2-1 tablet at bedtime  PTSD (post-traumatic stress disorder) -     pregabalin (LYRICA) 200 MG capsule; Take 1 capsule (200 mg total) by  mouth 3 (three) times daily.  Attention deficit hyperactivity disorder (ADHD), combined type -     amphetamine-dextroamphetamine (ADDERALL XR) 20 MG 24 hr capsule; Take 1 capsule (20 mg total) by mouth daily.     Please see After Visit Summary for patient specific instructions.  Future Appointments  Date Time Provider Department Center  08/21/2022  9:30 AM Corie Chiquito, PMHNP [facility] None    No orders of the defined types were placed in this encounter.   -------------------------------

## 2022-07-12 ENCOUNTER — Telehealth: Payer: Self-pay | Admitting: Psychiatry

## 2022-07-12 NOTE — Telephone Encounter (Signed)
Pt called and said that her insurance [employer] sent [place] two requests for a PA on her adderall. She said that we would need to do a  provider appeal.

## 2022-07-14 MED ORDER — AMPHETAMINE-DEXTROAMPHET ER 20 MG PO CP24: 20.0000 mg | ORAL_CAPSULE | Freq: Every day | ORAL | 0 refills | Status: DC

## 2022-07-22 NOTE — Telephone Encounter (Signed)
Noted-working on this

## 2022-07-31 NOTE — Telephone Encounter (Signed)
Office notes and reason for request of brand Adderall XR sent to [facility]

## 2022-08-06 ENCOUNTER — Telehealth: Payer: Self-pay

## 2022-08-06 DIAGNOSIS — F902 Attention-deficit hyperactivity disorder, combined type: Secondary | ICD-10-CM

## 2022-08-06 NOTE — Telephone Encounter (Signed)
Prior Authorization submitted for BRAND ADDERALL XR 30 MG CAPSULES approved effective 12/02/2021-12/01/2022 with [place], PA# [id number]  WT#[id number]

## 2022-08-07 MED ORDER — ADDERALL XR 20 MG PO CP24: 20.0000 mg | ORAL_CAPSULE | Freq: Every day | ORAL | 0 refills | Status: DC

## 2022-08-07 NOTE — Telephone Encounter (Signed)
LVM with info

## 2022-08-07 NOTE — Telephone Encounter (Signed)
Please let her know brand name Adderall XR has been approved and a script was sent to her pharmacy that she can fill on Sunday.

## 2022-08-14 ENCOUNTER — Other Ambulatory Visit: Payer: Self-pay | Admitting: Psychiatry

## 2022-08-14 DIAGNOSIS — F4310 Post-traumatic stress disorder, unspecified: Secondary | ICD-10-CM

## 2022-08-14 DIAGNOSIS — F319 Bipolar disorder, unspecified: Secondary | ICD-10-CM

## 2022-08-21 ENCOUNTER — Encounter: Payer: Self-pay | Admitting: Psychiatry

## 2022-08-21 ENCOUNTER — Ambulatory Visit (INDEPENDENT_AMBULATORY_CARE_PROVIDER_SITE_OTHER): Payer: Medicare HMO | Admitting: Psychiatry

## 2022-08-21 DIAGNOSIS — G4700 Insomnia, unspecified: Secondary | ICD-10-CM | POA: Diagnosis not present

## 2022-08-21 DIAGNOSIS — F4310 Post-traumatic stress disorder, unspecified: Secondary | ICD-10-CM | POA: Diagnosis not present

## 2022-08-21 DIAGNOSIS — F411 Generalized anxiety disorder: Secondary | ICD-10-CM

## 2022-08-21 DIAGNOSIS — F319 Bipolar disorder, unspecified: Secondary | ICD-10-CM | POA: Diagnosis not present

## 2022-08-21 MED ORDER — HYDROXYZINE HCL 25 MG PO TABS: ORAL_TABLET | ORAL | 0 refills | Status: DC

## 2022-08-21 MED ORDER — BUPROPION HCL ER (XL) 300 MG PO TB24: ORAL_TABLET | ORAL | 0 refills | Status: DC

## 2022-08-21 MED ORDER — BREXPIPRAZOLE 2 MG PO TABS: 2.0000 mg | ORAL_TABLET | Freq: Every day | ORAL | 0 refills | Status: DC

## 2022-08-21 MED ORDER — BUSPIRONE HCL 15 MG PO TABS: 15.0000 mg | ORAL_TABLET | Freq: Two times a day (BID) | ORAL | 0 refills | Status: DC

## 2022-08-21 NOTE — Progress Notes (Signed)
Julie Abbott [id number] Nov 14, 1976 46 y.o.  Subjective:   Patient ID:  Julie Abbott is a 46 y.o. (DOB 1975/12/28) female.  Chief Complaint:  Chief Complaint  Patient presents with   Anxiety   Depression   ADD    Anxiety    Depression        Past medical history includes anxiety.    Julie Abbott presents to the office today for follow-up of Bipolar D/O, anxiety, insomnia, and ADHD.   She reports that Adderall XR was approved for 30 mg and script sent for 20 mg.   She reports poor sleep with difficulty falling and staying asleep. She reports that migraines have been increasing.   She reports that she has been stressed in response to the loss of her mother and strained relationship with her daughter. Denies panic attacks. She reports increased anxiety. Reports anxiety about whether to attempt to return to work and fear of not being able to work. She reports feeling "angry" and attributes this to different things from the past. Low energy and motivation. She reports "there's always depression." Denies manic s/s- "not yet, but I am in that low and will probably hit mania soon." Denies SI.   Had pancreatitis with starting Ozempic. Had severe n/v and lost 7-8 lbs. She reports that she has recently been binge eating. She reports that appetite was less while taking Ozempic and appetite is now increased.  Recently shared information about her psychiatric illness with her husband.  Pregabalin last filled 08/14/22.  Past Psychiatric Medication Trials: Ritalin- Prescribed when she was 46 yo. Felt like a zombie.  Adderall- was taking before pain medication. Would have "crash" in the afternoon. Adderall XR Vyvanse- Ineffective Latuda- Was taking in [place] at 80 mg BID. Reports that Latuda seemed to decrease frequency of manic s/s. Depression was less. Denies side effects Vraylar- Ineffective Abilify Risperdal Geodon Seroquel- Excessive somnolence Lamictal- Tolerated ok. Had some  arthralgias. Depakote Trileptal Wellbutrin- Has taken long-term. Was increased to 300 mg po qd in the last 1-2 years. Denies side effects. Helps with decreasing smoking. Citalopram Cymbalta Buspar- Started by PCP in [place] after major stressor before moving to . Thinks it is somewhat helpful for anxiety and rumination. Denies side effective. Ambien- Most effective for sleep.  Has taken for about 10-15 years Ambien CR Lunesta- ineffective. Melatonin Hydroxyzine Lyrica- Prescribed for pain, neuropathy, and mood stabilization.  Gabapentin-Adverse reaction Belsomra Trazodone- Adverse/paradoxical response  AIMS    Flowsheet Row Office Visit from 06/12/2022 in [facility] Office Visit from 10/02/2021 in [facility] Office Visit from 10/30/2020 in [facility] Office Visit from 04/10/2020 in [facility]  AIMS Total Score 7 3 3 3         Review of Systems:  Review of Systems  Gastrointestinal:        Recent pancreatitis with Ozempic. She reports that n/v have subsided.  Musculoskeletal:  Negative for gait problem.  Psychiatric/Behavioral:  Positive for depression.        Please refer to HPI    Medications: I have reviewed the patient's current medications.  Current Outpatient Medications  Medication Sig Dispense Refill   estradiol (CLIMARA) 0.06 MG/24HR 1 patch once a week.     losartan (COZAAR) 25 MG tablet Take by mouth.     pregabalin (LYRICA) 200 MG capsule Take 1 capsule (200 mg total) by mouth 3 (three) times daily. 90 capsule 2   rosuvastatin (CRESTOR) 10 MG tablet Take by mouth.  zolpidem (AMBIEN) 10 MG tablet Take 1/2-1 tablet at bedtime 30 tablet 2   ADDERALL XR 20 MG 24 hr capsule Take 1 capsule (20 mg total) by mouth daily. (Patient not taking: Reported on 08/21/2022) 30 capsule 0   brexpiprazole (REXULTI) 2 MG TABS tablet Take 1 tablet (2 mg total) by mouth daily. 90 tablet 0   buPROPion (WELLBUTRIN XL)  300 MG 24 hr tablet TAKE 1 TABLET BY MOUTH ONCE DAILY IN THE MORNING 90 tablet 0   busPIRone (BUSPAR) 15 MG tablet Take 1 tablet (15 mg total) by mouth 2 (two) times daily. 180 tablet 0   hydrOXYzine (ATARAX) 25 MG tablet TAKE 1 TABLET BY MOUTH EVERYDAY AT BEDTIME 90 tablet 0   No current facility-administered medications for this visit.    Medication Side Effects: None  Allergies:  Allergies  Allergen Reactions   Bee Venom    Gabapentin    Lortab [Hydrocodone-Acetaminophen]     Past Medical History:  Diagnosis Date   Back pain    Diabetes mellitus, type II (HCC)    Interstitial cystitis    PCOS (polycystic ovarian syndrome)     Past Medical History, Surgical history, Social history, and Family history were reviewed and updated as appropriate.   Please see review of systems for further details on the patient's review from today.   Objective:   Physical Exam:  BP 106/67   Pulse 90   Physical Exam Constitutional:      General: She is not in acute distress. Musculoskeletal:        General: No deformity.  Neurological:     Mental Status: She is alert and oriented to person, place, and time.     Coordination: Coordination normal.  Psychiatric:        Attention and Perception: Attention and perception normal. She does not perceive auditory or visual hallucinations.        Mood and Affect: Mood is anxious. Affect is not labile, blunt, angry or inappropriate.        Speech: Speech normal.        Behavior: Behavior normal.        Thought Content: Thought content normal. Thought content is not paranoid or delusional. Thought content does not include homicidal or suicidal ideation. Thought content does not include homicidal or suicidal plan.        Cognition and Memory: Cognition and memory normal.        Judgment: Judgment normal.     Comments: Insight intact Dysphoric mood     Lab Review:  No results found for: "NA", "K", "CL", "CO2", "GLUCOSE", "BUN", "CREATININE",  "CALCIUM", "PROT", "ALBUMIN", "AST", "ALT", "ALKPHOS", "BILITOT", "GFRNONAA", "GFRAA"  No results found for: "WBC", "RBC", "HGB", "HCT", "PLT", "MCV", "MCH", "MCHC", "RDW", "LYMPHSABS", "MONOABS", "EOSABS", "BASOSABS"  No results found for: "POCLITH", "LITHIUM"   No results found for: "PHENYTOIN", "PHENOBARB", "VALPROATE", "CBMZ"   .res Assessment: Plan:    Pt reports that her pharmacy has not been able to fill Adderall XR since PA was approved for 30 mg dose and script on file is for 20 mg daily. Will request that clinical staff initiate a PA for brand name Adderall XR 20 mg daily.  Continue Rexulti 2 mg po qd for mood s/s.  Continue Buspar 15 mg po BID for anxiety.  Continue Wellbutrin XL 300 mg po qd for depression.  Continue Hydroxyzine 25 mg po QHS for insomnia.  Continue Lyrica 200 mg TID for anxiety and mood. She reports that  Lyrica has also been helpful for pain.  Continue Ambien 10 mg po QHS for insomnia.  Pt to follow-up with this provider in 6 weeks or sooner if clinically indicated.  Patient advised to contact office with any questions, adverse effects, or acute worsening in signs and symptoms.   Dot was seen today for anxiety, depression and add.  Diagnoses and all orders for this visit:  Bipolar affective disorder, remission status unspecified ([place]) -     brexpiprazole (REXULTI) 2 MG TABS tablet; Take 1 tablet (2 mg total) by mouth daily. -     buPROPion (WELLBUTRIN XL) 300 MG 24 hr tablet; TAKE 1 TABLET BY MOUTH ONCE DAILY IN THE MORNING  PTSD (post-traumatic stress disorder) -     brexpiprazole (REXULTI) 2 MG TABS tablet; Take 1 tablet (2 mg total) by mouth daily. -     busPIRone (BUSPAR) 15 MG tablet; Take 1 tablet (15 mg total) by mouth 2 (two) times daily.  Insomnia, unspecified type -     hydrOXYzine (ATARAX) 25 MG tablet; TAKE 1 TABLET BY MOUTH EVERYDAY AT BEDTIME  GAD (generalized anxiety disorder) -     hydrOXYzine (ATARAX) 25 MG tablet; TAKE 1 TABLET BY  MOUTH EVERYDAY AT BEDTIME     Please see After Visit Summary for patient specific instructions.  Future Appointments  Date Time Provider [place]  10/17/2022 10:00 AM Thayer Headings, PMHNP [facility] None    No orders of the defined types were placed in this encounter.   -------------------------------

## 2022-08-23 ENCOUNTER — Telehealth: Payer: Self-pay

## 2022-09-27 ENCOUNTER — Other Ambulatory Visit: Payer: Self-pay | Admitting: Psychiatry

## 2022-09-27 DIAGNOSIS — G4700 Insomnia, unspecified: Secondary | ICD-10-CM

## 2022-10-17 ENCOUNTER — Ambulatory Visit: Payer: Medicare HMO | Admitting: Psychiatry

## 2022-10-17 ENCOUNTER — Encounter: Payer: Self-pay | Admitting: Psychiatry

## 2022-10-17 DIAGNOSIS — F4310 Post-traumatic stress disorder, unspecified: Secondary | ICD-10-CM | POA: Diagnosis not present

## 2022-10-17 DIAGNOSIS — F411 Generalized anxiety disorder: Secondary | ICD-10-CM | POA: Diagnosis not present

## 2022-10-17 DIAGNOSIS — G4700 Insomnia, unspecified: Secondary | ICD-10-CM | POA: Diagnosis not present

## 2022-10-17 DIAGNOSIS — F319 Bipolar disorder, unspecified: Secondary | ICD-10-CM | POA: Diagnosis not present

## 2022-10-17 MED ORDER — HYDROXYZINE HCL 25 MG PO TABS: ORAL_TABLET | ORAL | 0 refills | Status: DC

## 2022-10-17 MED ORDER — PREGABALIN 200 MG PO CAPS: 200.0000 mg | ORAL_CAPSULE | Freq: Three times a day (TID) | ORAL | 2 refills | Status: DC

## 2022-10-17 MED ORDER — BUPROPION HCL ER (XL) 300 MG PO TB24: ORAL_TABLET | ORAL | 0 refills | Status: DC

## 2022-10-17 MED ORDER — BUSPIRONE HCL 15 MG PO TABS: 15.0000 mg | ORAL_TABLET | Freq: Two times a day (BID) | ORAL | 0 refills | Status: DC

## 2022-10-17 MED ORDER — BREXPIPRAZOLE 2 MG PO TABS: 2.0000 mg | ORAL_TABLET | Freq: Every day | ORAL | 0 refills | Status: DC

## 2022-10-17 NOTE — Progress Notes (Signed)
Julie Abbott [id number] 1976/02/25 46 y.o.  Subjective:   Patient ID:  Julie Abbott is a 46 y.o. (DOB [birth year]) female.  Chief Complaint:  Chief Complaint  Patient presents with   Depression    HPI Albie Bazin presents to the office today for follow-up of Bipolar disorder, anxiety, ADHD, and insomnia.   She reports depression- "I don't really care about the holidays." Denies manic s/s. She reports recent increased irritability. Anxiety in response to husband asking her questions about the future and if she is able to return to work. She reports that she has gained weight. She reports that her appetite fluctuates from not eating to eating constantly. Low energy and motivation. She reports that she has periods where she does not take medication- "I'm just tired of it." She reports that she has been smoking more since she has not been taking Wellbutrin XL consistently. Denies SI.   She reports that she does not take Adderall unless she can take it early in the morning. She reports that she is able to focus better when she takes Adderall- "I'm not so all over the place."   She has not been plucking her eyebrows recently.   Adderall XR last filled 08/23/22. Pregabalin last filled 10/11/22 x 3. Ambien last filled 09/27/22.  Past Psychiatric Medication Trials: Ritalin- Prescribed when she was 46 yo. Felt like a zombie.  Adderall- was taking before pain medication. Would have "crash" in the afternoon. Adderall XR Vyvanse- Ineffective Latuda- Was taking in [place] at 80 mg BID. Reports that Latuda seemed to decrease frequency of manic s/s. Depression was less. Denies side effects Vraylar- Ineffective Abilify Risperdal Geodon Seroquel- Excessive somnolence Lamictal- Tolerated ok. Had some arthralgias. Depakote Trileptal Wellbutrin- Has taken long-term. Was increased to 300 mg po qd in the last 1-2 years. Denies side effects. Helps with decreasing  smoking. Citalopram Cymbalta Buspar- Started by PCP in [place] after major stressor before moving to [place]. Thinks it is somewhat helpful for anxiety and rumination. Denies side effective. Ambien- Most effective for sleep.  Has taken for about 10-15 years Ambien CR Lunesta- ineffective. Melatonin Hydroxyzine Lyrica- Prescribed for pain, neuropathy, and mood stabilization.  Gabapentin-Adverse reaction Belsomra Trazodone- Adverse/paradoxical response  AIMS    Flowsheet Row Office Visit from 10/17/2022 in [facility] Office Visit from 06/12/2022 in [facility] Office Visit from 10/02/2021 in [facility] Office Visit from 10/30/2020 in [facility] Office Visit from 04/10/2020 in [facility]  AIMS Total Score 2 7 3 3 3         Review of Systems:  Review of Systems  Cardiovascular:        She reports that her BP has been elevated. She will be starting Losartan HCTZ. She reports that she is not taking Adderall daily  Endocrine:       She reports PCOS flare. Glucose levels have been elevated.  Genitourinary:  Positive for frequency and urgency.  Musculoskeletal:  Positive for back pain and neck pain. Negative for gait problem.  Neurological:  Positive for tremors.       Periods of RLS  Psychiatric/Behavioral:         Please refer to HPI    Medications: I have reviewed the patient's current medications.  Current Outpatient Medications  Medication Sig Dispense Refill   ADDERALL XR 20 MG 24 hr capsule Take 1 capsule (20 mg total) by mouth daily. (Patient not taking: Reported on 08/21/2022) 30 capsule 0   brexpiprazole (REXULTI) 2  MG TABS tablet Take 1 tablet (2 mg total) by mouth daily. 90 tablet 0   buPROPion (WELLBUTRIN XL) 300 MG 24 hr tablet TAKE 1 TABLET BY MOUTH ONCE DAILY IN THE MORNING 90 tablet 0   busPIRone (BUSPAR) 15 MG tablet Take 1 tablet (15 mg total) by mouth 2 (two) times daily. 180 tablet 0    estradiol (CLIMARA) 0.06 MG/24HR 1 patch once a week.     hydrOXYzine (ATARAX) 25 MG tablet TAKE 1 TABLET BY MOUTH EVERYDAY AT BEDTIME 90 tablet 0   losartan (COZAAR) 25 MG tablet Take by mouth.     [START ON 11/08/2022] pregabalin (LYRICA) 200 MG capsule Take 1 capsule (200 mg total) by mouth 3 (three) times daily. 90 capsule 2   rosuvastatin (CRESTOR) 10 MG tablet Take by mouth.     zolpidem (AMBIEN) 10 MG tablet TAKE 1/2-1 TABLET BY MOUTH AT BEDTIME 30 tablet 2   No current facility-administered medications for this visit.    Medication Side Effects: None  Allergies:  Allergies  Allergen Reactions   Bee Venom    Gabapentin    Lortab [Hydrocodone-Acetaminophen]     Past Medical History:  Diagnosis Date   Back pain    Diabetes mellitus, type II (HCC)    Interstitial cystitis    PCOS (polycystic ovarian syndrome)     Past Medical History, Surgical history, Social history, and Family history were reviewed and updated as appropriate.   Please see review of systems for further details on the patient's review from today.   Objective:   Physical Exam:  BP (!) 150/91   Pulse 89   Physical Exam Constitutional:      General: She is not in acute distress. Musculoskeletal:        General: No deformity.  Neurological:     Mental Status: She is alert and oriented to person, place, and time.     Coordination: Coordination normal.  Psychiatric:        Attention and Perception: Attention and perception normal. She does not perceive auditory or visual hallucinations.        Mood and Affect: Mood is depressed. Mood is not anxious. Affect is not labile, blunt, angry or inappropriate.        Speech: Speech normal.        Behavior: Behavior normal.        Thought Content: Thought content normal. Thought content is not paranoid or delusional. Thought content does not include homicidal or suicidal ideation. Thought content does not include homicidal or suicidal plan.        Cognition and  Memory: Cognition and memory normal.        Judgment: Judgment normal.     Comments: Insight intact     Lab Review:  No results found for: "NA", "K", "CL", "CO2", "GLUCOSE", "BUN", "CREATININE", "CALCIUM", "PROT", "ALBUMIN", "AST", "ALT", "ALKPHOS", "BILITOT", "GFRNONAA", "GFRAA"  No results found for: "WBC", "RBC", "HGB", "HCT", "PLT", "MCV", "MCH", "MCHC", "RDW", "LYMPHSABS", "MONOABS", "EOSABS", "BASOSABS"  No results found for: "POCLITH", "LITHIUM"   No results found for: "PHENYTOIN", "PHENOBARB", "VALPROATE", "CBMZ"   .res Assessment: Plan:    Discussed holding Adderall XR until BP improves and pt is in agreement with this plan. She reports that she has Adderall XR remaining since she has not been taking it every day. She is working with PCP to lower BP and will be starting home BP monitoring and Losartan HCTZ. Continue Rexulti 2 mg po qd for mood s/s.  Continue Buspar 15 mg po BID for anxiety.  Continue Wellbutrin XL 300 mg po qd for depression.  Continue Hydroxyzine 25 mg po QHS for insomnia.  Continue Lyrica 200 mg TID for anxiety and mood.  Continue Ambien 10 mg po QHS for insomnia.  Pt to follow-up in 2 months or sooner if clinically indicated.  Patient advised to contact office with any questions, adverse effects, or acute worsening in signs and symptoms.   Quina was seen today for depression.  Diagnoses and all orders for this visit:  PTSD (post-traumatic stress disorder) -     busPIRone (BUSPAR) 15 MG tablet; Take 1 tablet (15 mg total) by mouth 2 (two) times daily. -     brexpiprazole (REXULTI) 2 MG TABS tablet; Take 1 tablet (2 mg total) by mouth daily. -     pregabalin (LYRICA) 200 MG capsule; Take 1 capsule (200 mg total) by mouth 3 (three) times daily.  Bipolar affective disorder, remission status unspecified (HCC) -     buPROPion (WELLBUTRIN XL) 300 MG 24 hr tablet; TAKE 1 TABLET BY MOUTH ONCE DAILY IN THE MORNING -     brexpiprazole (REXULTI) 2 MG TABS  tablet; Take 1 tablet (2 mg total) by mouth daily. -     pregabalin (LYRICA) 200 MG capsule; Take 1 capsule (200 mg total) by mouth 3 (three) times daily.  GAD (generalized anxiety disorder) -     hydrOXYzine (ATARAX) 25 MG tablet; TAKE 1 TABLET BY MOUTH EVERYDAY AT BEDTIME  Insomnia, unspecified type -     hydrOXYzine (ATARAX) 25 MG tablet; TAKE 1 TABLET BY MOUTH EVERYDAY AT BEDTIME     Please see After Visit Summary for patient specific instructions.  No future appointments.  No orders of the defined types were placed in this encounter.   -------------------------------

## 2022-11-22 LAB — COLOGUARD: COLOGUARD: NEGATIVE

## 2022-12-17 ENCOUNTER — Encounter: Payer: Self-pay | Admitting: Psychiatry

## 2022-12-17 ENCOUNTER — Telehealth (INDEPENDENT_AMBULATORY_CARE_PROVIDER_SITE_OTHER): Payer: Medicare HMO | Admitting: Psychiatry

## 2022-12-17 DIAGNOSIS — F411 Generalized anxiety disorder: Secondary | ICD-10-CM

## 2022-12-17 DIAGNOSIS — F319 Bipolar disorder, unspecified: Secondary | ICD-10-CM | POA: Diagnosis not present

## 2022-12-17 DIAGNOSIS — G4700 Insomnia, unspecified: Secondary | ICD-10-CM

## 2022-12-17 DIAGNOSIS — F4310 Post-traumatic stress disorder, unspecified: Secondary | ICD-10-CM | POA: Diagnosis not present

## 2022-12-17 MED ORDER — ZOLPIDEM TARTRATE 10 MG PO TABS: ORAL_TABLET | ORAL | 2 refills | Status: DC

## 2022-12-17 MED ORDER — HYDROXYZINE HCL 25 MG PO TABS: ORAL_TABLET | ORAL | 0 refills | Status: DC

## 2022-12-17 MED ORDER — BUPROPION HCL ER (XL) 300 MG PO TB24: ORAL_TABLET | ORAL | 0 refills | Status: DC

## 2022-12-17 MED ORDER — PREGABALIN 200 MG PO CAPS: 200.0000 mg | ORAL_CAPSULE | Freq: Three times a day (TID) | ORAL | 2 refills | Status: DC

## 2022-12-17 MED ORDER — BUSPIRONE HCL 15 MG PO TABS: 15.0000 mg | ORAL_TABLET | Freq: Two times a day (BID) | ORAL | 0 refills | Status: DC

## 2022-12-17 MED ORDER — BREXPIPRAZOLE 2 MG PO TABS: 2.0000 mg | ORAL_TABLET | Freq: Every day | ORAL | 0 refills | Status: DC

## 2022-12-17 NOTE — Progress Notes (Signed)
Julie Abbott [id number] [birth year] 47 y.o.  Virtual Visit via Video Note  I connected with pt @ on 12/17/22 at 10:00 AM EST by a video enabled telemedicine application and verified that I am speaking with the correct person using two identifiers.   I discussed the limitations of evaluation and management by telemedicine and the availability of in person appointments. The patient expressed understanding and agreed to proceed.  I discussed the assessment and treatment plan with the patient. The patient was provided an opportunity to ask questions and all were answered. The patient agreed with the plan and demonstrated an understanding of the instructions.   The patient was advised to call back or seek an in-person evaluation if the symptoms worsen or if the condition fails to improve as anticipated.  I provided 30 minutes of non-face-to-face time during this encounter.  The patient was located at home.  The provider was located at [place].   Thayer Headings, PMHNP   Subjective:   Patient ID:  Julie Abbott is a 47 y.o. (DOB 05-22-1976) female.  Chief Complaint:  Chief Complaint  Patient presents with   Fatigue    HPI Julie Abbott presents for follow-up of ADHD, Anxiety, Bipolar disorder, and insomnia. She reports, "I'm tired... I feel a little run down." She reports increased appetite. She reports that she tends to overeat when she is in pain. She does not think Rexulti is causing weight gain since she initially lost weight after starting Rexulti. She repots, "I think I am going through one of my little slumps." Mood is mildly lower. Denies any manic symptoms. She reports that her irritability is at baseline. She reports lower energy and motivation, with less interest in things. Denies increased sleep. She reports, "my anxiety is ok as long as I stick to myself." She has anxiety with getting out and being around others. She tends to pick up her groceries. She reports that  her concentration is "off." She reports that she is able to focus when she takes Adderall and "when I don't take Adderall I am all over the place." She has not been taking Adderall recently due to elevated BP. Denies SI.   She has been thinking about her mother more recently with this being the first Christmas without her. She reports that Christmas was her mother's favorite holiday. Daughter is now living in [place]. She is from [place]. She describes herself as an "introvert" and does not have many friends.   Pregabalin last filled 12/12/22 x 2 Ambien last filled 11/20/22 x 2  Past Psychiatric Medication Trials: Ritalin- Prescribed when she was 47 yo. Felt like a zombie.  Adderall- was taking before pain medication. Would have "crash" in the afternoon. Adderall XR Vyvanse- Ineffective Latuda- Was taking in [place] at 80 mg BID. Reports that Latuda seemed to decrease frequency of manic s/s. Depression was less. Denies side effects Vraylar- Ineffective Abilify Risperdal Geodon Seroquel- Excessive somnolence Lamictal- Tolerated ok. Had some arthralgias. Depakote Trileptal Wellbutrin- Has taken long-term. Was increased to 300 mg po qd in the last 1-2 years. Denies side effects. Helps with decreasing smoking. Citalopram Cymbalta Buspar- Started by PCP in [place] after major stressor before moving to [place]. Thinks it is somewhat helpful for anxiety and rumination. Denies side effective. Ambien- Most effective for sleep.  Has taken for about 10-15 years Ambien CR Lunesta- ineffective. Melatonin Hydroxyzine Lyrica- Prescribed for pain, neuropathy, and mood stabilization.  Gabapentin-Adverse reaction Belsomra Trazodone- Adverse/paradoxical response   Review of Systems:  Review  of Systems  Constitutional:  Positive for fatigue.  Musculoskeletal:  Negative for gait problem.  Neurological:  Negative for tremors.  Psychiatric/Behavioral:         Please refer to HPI    Her BP has been elevated upon  awakening. She reports that her BP has been higher since she had pancreatitis. PCP has initiated referral to GI, Endocrinology, and obgyn.  Medications: I have reviewed the patient's current medications.  Current Outpatient Medications  Medication Sig Dispense Refill   estradiol (CLIMARA) 0.06 MG/24HR 1 patch once a week.     losartan-hydrochlorothiazide (HYZAAR) 50-12.5 MG tablet Take 1 tablet by mouth daily.     rosuvastatin (CRESTOR) 10 MG tablet Take by mouth.     ADDERALL XR 20 MG 24 hr capsule Take 1 capsule (20 mg total) by mouth daily. (Patient not taking: Reported on 08/21/2022) 30 capsule 0   brexpiprazole (REXULTI) 2 MG TABS tablet Take 1 tablet (2 mg total) by mouth daily. 90 tablet 0   buPROPion (WELLBUTRIN XL) 300 MG 24 hr tablet TAKE 1 TABLET BY MOUTH ONCE DAILY IN THE MORNING 90 tablet 0   busPIRone (BUSPAR) 15 MG tablet Take 1 tablet (15 mg total) by mouth 2 (two) times daily. 180 tablet 0   hydrOXYzine (ATARAX) 25 MG tablet TAKE 1 TABLET BY MOUTH EVERYDAY AT BEDTIME 90 tablet 0   [START ON 02/06/2023] pregabalin (LYRICA) 200 MG capsule Take 1 capsule (200 mg total) by mouth 3 (three) times daily. 90 capsule 2   [START ON 01/15/2023] zolpidem (AMBIEN) 10 MG tablet TAKE 1/2-1 TABLET BY MOUTH AT BEDTIME 30 tablet 2   No current facility-administered medications for this visit.    Medication Side Effects: None  Allergies:  Allergies  Allergen Reactions   Bee Venom    Gabapentin    Lisinopril Cough   Lortab [Hydrocodone-Acetaminophen]     Past Medical History:  Diagnosis Date   Back pain    Diabetes mellitus, type II (HCC)    Interstitial cystitis    PCOS (polycystic ovarian syndrome)     Family History  Problem Relation Age of Onset   Bipolar disorder Daughter    Anxiety disorder Daughter    Depression Mother    Alcohol abuse Mother    Drug abuse Mother    OCD Mother    Panic disorder Mother    Alcohol abuse Maternal Aunt    Polycystic ovary syndrome  Paternal Aunt    Alcohol abuse Maternal Uncle    Alcohol abuse Maternal Grandmother    Anxiety disorder Maternal Grandmother    Depression Maternal Grandmother    Polycystic ovary syndrome Paternal Grandmother     Social History   Socioeconomic History   Marital status: Married    Spouse name: Not on file   Number of children: Not on file   Years of education: Not on file   Highest education level: Not on file  Occupational History   Not on file  Tobacco Use   Smoking status: Every Day    Packs/day: 0.25    Types: Cigarettes   Smokeless tobacco: Never  Substance and Sexual Activity   Alcohol use: Never   Drug use: Never   Sexual activity: Not on file  Other Topics Concern   Not on file  Social History Narrative   Not on file   Social Determinants of Health   Financial Resource Strain: Not on file  Food Insecurity: Not on file  Transportation Needs: Not on  file  Physical Activity: Not on file  Stress: Not on file  Social Connections: Not on file  Intimate Partner Violence: Not on file    Past Medical History, Surgical history, Social history, and Family history were reviewed and updated as appropriate.   Please see review of systems for further details on the patient's review from today.   Objective:   Physical Exam:  BP (!) 153/93   Pulse 87   Physical Exam Neurological:     Mental Status: She is alert and oriented to person, place, and time.     Cranial Nerves: No dysarthria.  Psychiatric:        Attention and Perception: Attention and perception normal.        Mood and Affect: Mood is anxious.        Speech: Speech normal.        Behavior: Behavior is cooperative.        Thought Content: Thought content normal. Thought content is not paranoid or delusional. Thought content does not include homicidal or suicidal ideation. Thought content does not include homicidal or suicidal plan.        Cognition and Memory: Cognition and memory normal.         Judgment: Judgment normal.     Comments: Insight intact Mood is dysthymic     Lab Review:  No results found for: "NA", "K", "CL", "CO2", "GLUCOSE", "BUN", "CREATININE", "CALCIUM", "PROT", "ALBUMIN", "AST", "ALT", "ALKPHOS", "BILITOT", "GFRNONAA", "GFRAA"  No results found for: "WBC", "RBC", "HGB", "HCT", "PLT", "MCV", "MCH", "MCHC", "RDW", "LYMPHSABS", "MONOABS", "EOSABS", "BASOSABS"  No results found for: "POCLITH", "LITHIUM"   No results found for: "PHENYTOIN", "PHENOBARB", "VALPROATE", "CBMZ"   .res Assessment: Plan:    Pt seen for 30 minutes and time spent discussing recent medical issues and upcoming apts with specialists to address various medical concerns. Discussed that some of these medical issues could contribute to symptoms that affect mental health symptoms to include fatigue, sleep, irritability, concentration, appetite, etc. She reports that she would like to continue current medications at this time until she is seen by specialists. Discussed considering medication changes at next visit if recommended by specialists. Advised pt to contact office if specialist recommends any immediate medication changes.  Will continue to hold Adderall XR since BP remains elevated. Pt reports that HTN is one of the medical issues being addressed currently by her PCP and specialists.  Will continue Rexulti 2 mg daily for mood symptoms. She reports that she does not think weight gain is related to Rexulti since she initially lost weight after starting Rexulti.  Continue Wellbutrin XL 300 mg daily for depression.  Continue Buspar 15 mg po BID for anxiety.  Continue Hydroxyzine 25 mg at bedtime for anxiety and insomnia.  Continue Pregabalin 200 mg po TID since this has been helpful for her mood symptoms, anxiety, and pain.  Continue Ambien 10 mg 1/2-1 tab po QHS for insomnia.  Pt to follow-up in 2 months or sooner if clinically indicated.  Patient advised to contact office with any questions,  adverse effects, or acute worsening in signs and symptoms.   Julie Abbott was seen today for fatigue.  Diagnoses and all orders for this visit:  Bipolar affective disorder, remission status unspecified (HCC) -     buPROPion (WELLBUTRIN XL) 300 MG 24 hr tablet; TAKE 1 TABLET BY MOUTH ONCE DAILY IN THE MORNING -     pregabalin (LYRICA) 200 MG capsule; Take 1 capsule (200 mg total) by mouth 3 (  three) times daily. -     brexpiprazole (REXULTI) 2 MG TABS tablet; Take 1 tablet (2 mg total) by mouth daily.  PTSD (post-traumatic stress disorder) -     busPIRone (BUSPAR) 15 MG tablet; Take 1 tablet (15 mg total) by mouth 2 (two) times daily. -     pregabalin (LYRICA) 200 MG capsule; Take 1 capsule (200 mg total) by mouth 3 (three) times daily. -     brexpiprazole (REXULTI) 2 MG TABS tablet; Take 1 tablet (2 mg total) by mouth daily.  GAD (generalized anxiety disorder) -     hydrOXYzine (ATARAX) 25 MG tablet; TAKE 1 TABLET BY MOUTH EVERYDAY AT BEDTIME  Insomnia, unspecified type -     hydrOXYzine (ATARAX) 25 MG tablet; TAKE 1 TABLET BY MOUTH EVERYDAY AT BEDTIME -     zolpidem (AMBIEN) 10 MG tablet; TAKE 1/2-1 TABLET BY MOUTH AT BEDTIME     Please see After Visit Summary for patient specific instructions.  No future appointments.  No orders of the defined types were placed in this encounter.     -------------------------------

## 2023-02-13 ENCOUNTER — Ambulatory Visit: Payer: Medicare HMO | Admitting: Psychiatry

## 2023-02-13 ENCOUNTER — Encounter: Payer: Self-pay | Admitting: Psychiatry

## 2023-02-13 DIAGNOSIS — F411 Generalized anxiety disorder: Secondary | ICD-10-CM | POA: Diagnosis not present

## 2023-02-13 DIAGNOSIS — F4310 Post-traumatic stress disorder, unspecified: Secondary | ICD-10-CM

## 2023-02-13 DIAGNOSIS — G4700 Insomnia, unspecified: Secondary | ICD-10-CM

## 2023-02-13 DIAGNOSIS — F319 Bipolar disorder, unspecified: Secondary | ICD-10-CM

## 2023-02-13 MED ORDER — BUSPIRONE HCL 15 MG PO TABS: 15.0000 mg | ORAL_TABLET | Freq: Two times a day (BID) | ORAL | 0 refills | Status: DC

## 2023-02-13 MED ORDER — HYDROXYZINE HCL 25 MG PO TABS: ORAL_TABLET | ORAL | 0 refills | Status: DC

## 2023-02-13 MED ORDER — BREXPIPRAZOLE 2 MG PO TABS: 2.0000 mg | ORAL_TABLET | Freq: Every day | ORAL | 0 refills | Status: DC

## 2023-02-13 MED ORDER — BUPROPION HCL ER (XL) 300 MG PO TB24: ORAL_TABLET | ORAL | 0 refills | Status: DC

## 2023-02-13 NOTE — Progress Notes (Signed)
Julie Abbott CL:[id number] [birth year] 47 y.o.  Subjective:   Patient ID:  Julie Abbott is a 47 y.o. (DOB 1976/02/10) female.  Chief Complaint:  Chief Complaint  Patient presents with   Depression   Anxiety    Depression        Past medical history includes anxiety.   Anxiety     Julie Abbott presents to the office today for follow-up of Bipolar Disorder, Anxiety, ADHD, and insomnia.   She has seen endocrinologist and diabetic educator since last visit. She has an upcoming apt with a gynecologist and it has been about 7-8 years. Her PCP increased BP medication and she reports that she has not started new dose. She reports that she often does not want to take insulin in the evening- "no drive or initiative." Reports depressed mood. She reports occ irritability. Denies manic s/s. She reports poor sleep. She reports that Ambien is helpful for sleep initiation and then is awakened due to allergic cough. Low energy. Reports that it has been "hard" to make herself take a shower. She reports poor concentration- "I'm all over the place."   Reports rarely leaving the house except to go to medical apts. Reports having upset stomach in anticipation of medical appointments. She reports that recently anxiety is manifesting more as irritation and frustration.   Denies HI. Denies SI.   She reports that she would like to work and had a few interviews. She reports that her typing/keyboarding skills need improvement. 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   Julie Abbott had surgery on his hand and this has caused financial stressors. Talks to daughter in [place] on occasion.   Has not been able to stop smoking.   Ambien last filled 02/10/23, Pregabalin last filled 02/10/23.    Past Psychiatric Medication Trials: Ritalin- Prescribed when she was 47 yo. Felt like a zombie.  Adderall- was taking before pain medication. Would have "crash" in the  afternoon. Adderall XR Vyvanse- Ineffective Latuda- Was taking in [place] at 80 mg BID. Reports that Latuda seemed to decrease frequency of manic s/s. Depression was less. Denies side effects Vraylar- Ineffective Abilify Risperdal Geodon Seroquel- Excessive somnolence Lamictal- Tolerated ok. Had some arthralgias. Depakote Trileptal Wellbutrin- Has taken long-term. Was increased to 300 mg po qd in the last 1-2 years. Denies side effects. Helps with decreasing smoking. Citalopram Cymbalta Buspar- Started by PCP in [place] after major stressor before moving to . Thinks it is somewhat helpful for anxiety and rumination. Denies side effective. Ambien- Most effective for sleep.  Has taken for about 10-15 years Ambien CR Lunesta- ineffective. Melatonin Hydroxyzine Lyrica- Prescribed for pain, neuropathy, and mood stabilization.  Gabapentin-Adverse reaction Belsomra Trazodone- Adverse/paradoxical response   AIMS    Flowsheet Row Video Visit from 12/17/2022 in [facility] Office Visit from 10/17/2022 in [facility] Office Visit from 06/12/2022 in [facility] Office Visit from 10/02/2021 in [facility] Office Visit from 10/30/2020 in [facility]  AIMS Total Score 0 '2 7 3 3        '$ Review of Systems:  Review of Systems  Respiratory:  Positive for cough.   Genitourinary:        Decreased libido  Musculoskeletal:  Negative for gait problem.  Allergic/Immunologic: Positive for environmental allergies.  Psychiatric/Behavioral:  Positive for depression.        Please refer to HPI    Medications: I  have reviewed the patient's current medications.  Current Outpatient Medications  Medication Sig Dispense Refill   estradiol (CLIMARA) 0.06 MG/24HR 1 patch once a week.     insulin lispro (HUMALOG) 100 UNIT/ML KwikPen Inject into the skin.     LANTUS  SOLOSTAR 100 UNIT/ML Solostar Pen Inject into the skin.     losartan-hydrochlorothiazide (HYZAAR) 50-12.5 MG tablet Take 1 tablet by mouth daily.     pregabalin (LYRICA) 200 MG capsule Take 1 capsule (200 mg total) by mouth 3 (three) times daily. 90 capsule 2   rosuvastatin (CRESTOR) 10 MG tablet Take by mouth.     zolpidem (AMBIEN) 10 MG tablet TAKE 1/2-1 TABLET BY MOUTH AT BEDTIME 30 tablet 2   ADDERALL XR 20 MG 24 hr capsule Take 1 capsule (20 mg total) by mouth daily. (Patient not taking: Reported on 08/21/2022) 30 capsule 0   brexpiprazole (REXULTI) 2 MG TABS tablet Take 1 tablet (2 mg total) by mouth daily. 90 tablet 0   buPROPion (WELLBUTRIN XL) 300 MG 24 hr tablet TAKE 1 TABLET BY MOUTH ONCE DAILY IN THE MORNING 90 tablet 0   busPIRone (BUSPAR) 15 MG tablet Take 1 tablet (15 mg total) by mouth 2 (two) times daily. 180 tablet 0   hydrOXYzine (ATARAX) 25 MG tablet TAKE 1 TABLET BY MOUTH EVERYDAY AT BEDTIME 90 tablet 0   No current facility-administered medications for this visit.    Medication Side Effects: None  Allergies:  Allergies  Allergen Reactions   Bee Venom    Gabapentin    Lisinopril Cough   Lortab [Hydrocodone-Acetaminophen]     Past Medical History:  Diagnosis Date   Back pain    Diabetes mellitus, type II ([facility])    Interstitial cystitis    PCOS (polycystic ovarian syndrome)     Past Medical History, Surgical history, Social history, and Family history were reviewed and updated as appropriate.   Please see review of systems for further details on the patient's review from today.   Objective:   Physical Exam:  BP 116/73   Pulse (!) 102   Physical Exam Constitutional:      General: She is not in acute distress. Musculoskeletal:        General: No deformity.  Neurological:     Mental Status: She is alert and oriented to person, place, and time.     Coordination: Coordination normal.  Psychiatric:        Attention and Perception: Attention and  perception normal. She does not perceive auditory or visual hallucinations.        Mood and Affect: Mood is anxious. Affect is not labile, blunt, angry or inappropriate.        Speech: Speech normal.        Behavior: Behavior normal.        Thought Content: Thought content normal. Thought content is not paranoid or delusional. Thought content does not include homicidal or suicidal ideation. Thought content does not include homicidal or suicidal plan.        Cognition and Memory: Cognition and memory normal.        Judgment: Judgment normal.     Comments: Insight intact Dysphoric mood     Lab Review:  No results found for: "NA", "K", "CL", "CO2", "GLUCOSE", "BUN", "CREATININE", "CALCIUM", "PROT", "ALBUMIN", "AST", "ALT", "ALKPHOS", "BILITOT", "GFRNONAA", "GFRAA"  No results found for: "WBC", "RBC", "HGB", "HCT", "PLT", "MCV", "MCH", "MCHC", "RDW", "LYMPHSABS", "MONOABS", "EOSABS", "BASOSABS"  No results found for: "POCLITH", "LITHIUM"  No results found for: "PHENYTOIN", "PHENOBARB", "VALPROATE", "CBMZ"   .res Assessment: Plan:    Patient seen for 30 minutes and time spent discussing changes in history.  She reports that blood pressure medication was increased, however she has not started higher dose yet.  Discussed considering starting higher dose of blood pressure medication, and then she may be able to tolerate restarting Adderall.  Patient reports that she will try to increase antihypertensive. She reports that she would like to continue all other medications without changes at this time. Will continue Rexulti 2 mg daily for mood and anxiety symptoms. Will continue BuSpar 15 mg twice daily for anxiety. Continue hydroxyzine 25 mg at bedtime for insomnia. Continue Wellbutrin XL 300 mg daily for depression. Continue Ambien 10 mg 1/2-1 tab as bedtime for insomnia. Continue Lyrica 200 mg 3 times daily for anxiety and mood stabilization.  Pt to follow-up in 2 months or sooner if  clinically indicated.  Patient advised to contact office with any questions, adverse effects, or acute worsening in signs and symptoms.   Jakeira was seen today for depression and anxiety.  Diagnoses and all orders for this visit:  PTSD (post-traumatic stress disorder) -     brexpiprazole (REXULTI) 2 MG TABS tablet; Take 1 tablet (2 mg total) by mouth daily. -     busPIRone (BUSPAR) 15 MG tablet; Take 1 tablet (15 mg total) by mouth 2 (two) times daily.  Bipolar affective disorder, remission status unspecified (HCC) -     brexpiprazole (REXULTI) 2 MG TABS tablet; Take 1 tablet (2 mg total) by mouth daily. -     buPROPion (WELLBUTRIN XL) 300 MG 24 hr tablet; TAKE 1 TABLET BY MOUTH ONCE DAILY IN THE MORNING  GAD (generalized anxiety disorder) -     hydrOXYzine (ATARAX) 25 MG tablet; TAKE 1 TABLET BY MOUTH EVERYDAY AT BEDTIME  Insomnia, unspecified type -     hydrOXYzine (ATARAX) 25 MG tablet; TAKE 1 TABLET BY MOUTH EVERYDAY AT BEDTIME     Please see After Visit Summary for patient specific instructions.  Future Appointments  Date Time Provider [place]  04/15/2023 11:00 AM Thayer Headings, PMHNP CP-CP None     No orders of the defined types were placed in this encounter.   -------------------------------

## 2023-04-15 ENCOUNTER — Encounter: Payer: Self-pay | Admitting: Psychiatry

## 2023-04-15 ENCOUNTER — Ambulatory Visit (INDEPENDENT_AMBULATORY_CARE_PROVIDER_SITE_OTHER): Payer: Medicare HMO | Admitting: Psychiatry

## 2023-04-15 DIAGNOSIS — F902 Attention-deficit hyperactivity disorder, combined type: Secondary | ICD-10-CM

## 2023-04-15 DIAGNOSIS — F319 Bipolar disorder, unspecified: Secondary | ICD-10-CM | POA: Diagnosis not present

## 2023-04-15 DIAGNOSIS — F411 Generalized anxiety disorder: Secondary | ICD-10-CM

## 2023-04-15 DIAGNOSIS — G4700 Insomnia, unspecified: Secondary | ICD-10-CM

## 2023-04-15 DIAGNOSIS — F4310 Post-traumatic stress disorder, unspecified: Secondary | ICD-10-CM

## 2023-04-15 MED ORDER — HYDROXYZINE HCL 25 MG PO TABS
ORAL_TABLET | ORAL | 0 refills | Status: DC
Start: 2023-04-15 — End: 2023-08-20

## 2023-04-15 MED ORDER — BUSPIRONE HCL 15 MG PO TABS
15.0000 mg | ORAL_TABLET | Freq: Two times a day (BID) | ORAL | 0 refills | Status: DC
Start: 2023-04-15 — End: 2023-08-20

## 2023-04-15 MED ORDER — PREGABALIN 200 MG PO CAPS
200.0000 mg | ORAL_CAPSULE | Freq: Three times a day (TID) | ORAL | 5 refills | Status: DC
Start: 2023-05-10 — End: 2023-10-15

## 2023-04-15 MED ORDER — AMPHETAMINE-DEXTROAMPHET ER 20 MG PO CP24
20.0000 mg | ORAL_CAPSULE | Freq: Every day | ORAL | 0 refills | Status: AC
Start: 2023-06-10 — End: ?

## 2023-04-15 MED ORDER — BUPROPION HCL ER (XL) 300 MG PO TB24
ORAL_TABLET | ORAL | 0 refills | Status: DC
Start: 2023-04-15 — End: 2023-08-20

## 2023-04-15 MED ORDER — ZOLPIDEM TARTRATE 10 MG PO TABS
ORAL_TABLET | ORAL | 5 refills | Status: AC
Start: 2023-05-13 — End: ?

## 2023-04-15 MED ORDER — ADDERALL XR 20 MG PO CP24: 20.0000 mg | ORAL_CAPSULE | Freq: Every day | ORAL | 0 refills | Status: DC

## 2023-04-15 MED ORDER — AMPHETAMINE-DEXTROAMPHET ER 20 MG PO CP24
20.0000 mg | ORAL_CAPSULE | Freq: Every day | ORAL | 0 refills | Status: DC
Start: 2023-05-13 — End: 2023-08-21

## 2023-04-15 MED ORDER — BREXPIPRAZOLE 2 MG PO TABS
2.0000 mg | ORAL_TABLET | Freq: Every day | ORAL | 0 refills | Status: DC
Start: 2023-04-15 — End: 2023-08-20

## 2023-04-15 NOTE — Progress Notes (Unsigned)
Julie Abbott [id number] 1976/01/09 47 y.o.  Subjective:   Patient ID:  Julie Abbott is a 47 y.o. (DOB June 07, 1976) female.  Chief Complaint:  Chief Complaint  Patient presents with   Follow-up    Anxiety, Bipolar D/O, ADHD, and insomnia    HPI Julie Abbott presents to the office today for follow-up of PTSD, Bipolar Disorder, ADHD, and insomnia.   She is now working for [employer] and started 03/24/23. "It's going alright." She has been having to work in a cubicle and "it's overwhelming." She has been taking Adderall since this seems to help with her anxiety and concentration. She initially had increased anxiety with starting work- "I have to step back" and re-frame, "this is not my issue."  She reports anxiety when she first goes into work and when she does not have something to do. She reports that there have been some triggering work experiences. She reports that her mood has been slightly low the last few days. Denies any manic s/s. She reports that her energy was up when she started work. She reports that her energy is slightly lower now because of being tired with return to work. She reports that she awakens in the middle of the night periodically. Sleep is adequate. Appetite has been decreased with Adderall. Denies SI.   Working full-time. She is working in investigations.   Daughter has been having dizziness and health issues.   Lyrica last filled 04/12/23 x 3. Ambien last filled 03/18/23 x 2.  Past Psychiatric Medication Trials: Ritalin- Prescribed when she was 47 yo. Felt like a zombie.  Adderall- was taking before pain medication. Would have "crash" in the afternoon. Adderall XR Vyvanse- Ineffective Latuda- Was taking in [place] at 80 mg BID. Reports that Latuda seemed to decrease frequency of manic s/s. Depression was less. Denies side effects Vraylar- Ineffective Abilify Risperdal Geodon Seroquel- Excessive somnolence Lamictal- Tolerated ok. Had some  arthralgias. Depakote Trileptal Wellbutrin- Has taken long-term. Was increased to 300 mg po qd in the last 1-2 years. Denies side effects. Helps with decreasing smoking. Citalopram Cymbalta Buspar- Started by PCP in [place] after major stressor before moving to [place]. Thinks it is somewhat helpful for anxiety and rumination. Denies side effective. Ambien- Most effective for sleep.  Has taken for about 10-15 years Ambien CR Lunesta- ineffective. Melatonin Hydroxyzine Lyrica- Prescribed for pain, neuropathy, and mood stabilization.  Gabapentin-Adverse reaction Belsomra Trazodone- Adverse/paradoxical response  AIMS    Flowsheet Row Video Visit from 12/17/2022 in [facility] Office Visit from 10/17/2022 in [facility] Office Visit from 06/12/2022 in [facility] Office Visit from 10/02/2021 in [facility] Office Visit from 10/30/2020 in [facility]  AIMS Total Score 0 2 7 3 3         Review of Systems:  Review of Systems  Gastrointestinal:  Positive for diarrhea.  Genitourinary:        She reports decreased sexual desire  Musculoskeletal:  Negative for gait problem.  Psychiatric/Behavioral:         Please refer to HPI    Medications: I have reviewed the patient's current medications.  Current Outpatient Medications  Medication Sig Dispense Refill   [START ON 05/13/2023] amphetamine-dextroamphetamine (ADDERALL XR) 20 MG 24 hr capsule Take 1 capsule (20 mg total) by mouth daily. 30 capsule 0   [START ON 06/10/2023] amphetamine-dextroamphetamine (ADDERALL XR) 20 MG 24 hr capsule Take 1 capsule (20 mg total) by mouth  daily. 30 capsule 0   estradiol (ESTRACE) 0.1 MG/GM vaginal cream Place vaginally.     insulin lispro (HUMALOG) 100 UNIT/ML KwikPen Inject into the skin.     LANTUS SOLOSTAR 100 UNIT/ML Solostar Pen Inject into the skin.      losartan-hydrochlorothiazide (HYZAAR) 50-12.5 MG tablet Take 1 tablet by mouth daily.     ADDERALL XR 20 MG 24 hr capsule Take 1 capsule (20 mg total) by mouth daily. 30 capsule 0   brexpiprazole (REXULTI) 2 MG TABS tablet Take 1 tablet (2 mg total) by mouth daily. 90 tablet 0   buPROPion (WELLBUTRIN XL) 300 MG 24 hr tablet TAKE 1 TABLET BY MOUTH ONCE DAILY IN THE MORNING 90 tablet 0   busPIRone (BUSPAR) 15 MG tablet Take 1 tablet (15 mg total) by mouth 2 (two) times daily. 180 tablet 0   estradiol (CLIMARA) 0.06 MG/24HR 1 patch once a week. (Patient not taking: Reported on 04/15/2023)     hydrOXYzine (ATARAX) 25 MG tablet TAKE 1 TABLET BY MOUTH EVERYDAY AT BEDTIME 90 tablet 0   [START ON 05/10/2023] pregabalin (LYRICA) 200 MG capsule Take 1 capsule (200 mg total) by mouth 3 (three) times daily. 90 capsule 5   rosuvastatin (CRESTOR) 10 MG tablet Take by mouth. (Patient not taking: Reported on 04/15/2023)     [START ON 05/13/2023] zolpidem (AMBIEN) 10 MG tablet TAKE 1/2-1 TABLET BY MOUTH AT BEDTIME 30 tablet 5   No current facility-administered medications for this visit.    Medication Side Effects: Appetite Suppression  Allergies:  Allergies  Allergen Reactions   Bee Venom    Gabapentin    Lisinopril Cough   Lortab [Hydrocodone-Acetaminophen]     Past Medical History:  Diagnosis Date   Back pain    Diabetes mellitus, type II (HCC)    Interstitial cystitis    PCOS (polycystic ovarian syndrome)     Past Medical History, Surgical history, Social history, and Family history were reviewed and updated as appropriate.   Please see review of systems for further details on the patient's review from today.   Objective:   Physical Exam:  BP (!) 142/93   Pulse (!) 106   Physical Exam Constitutional:      General: She is not in acute distress. Musculoskeletal:        General: No deformity.  Neurological:     Mental Status: She is alert and oriented to person, place, and time.      Coordination: Coordination normal.  Psychiatric:        Attention and Perception: Attention and perception normal. She does not perceive auditory or visual hallucinations.        Mood and Affect: Mood is anxious. Mood is not depressed. Affect is not labile, blunt, angry or inappropriate.        Speech: Speech normal.        Behavior: Behavior normal.        Thought Content: Thought content normal. Thought content is not paranoid or delusional. Thought content does not include homicidal or suicidal ideation. Thought content does not include homicidal or suicidal plan.        Cognition and Memory: Cognition and memory normal.        Judgment: Judgment normal.     Comments: Insight intact     Lab Review:  No results found for: "NA", "K", "CL", "CO2", "GLUCOSE", "BUN", "CREATININE", "CALCIUM", "PROT", "ALBUMIN", "AST", "ALT", "ALKPHOS", "BILITOT", "GFRNONAA", "GFRAA"  No results found for: "WBC", "RBC", "HGB", "HCT", "  PLT", "MCV", "MCH", "MCHC", "RDW", "LYMPHSABS", "MONOABS", "EOSABS", "BASOSABS"  No results found for: "POCLITH", "LITHIUM"   No results found for: "PHENYTOIN", "PHENOBARB", "VALPROATE", "CBMZ"   .res Assessment: Plan:   Will re-start Adderall since pt reports that she has needed additional help with focus since returning to work.  Continue Rexulti 2 mg daily for mood and anxiety symptoms. Continue BuSpar 15 mg twice daily for anxiety. Continue hydroxyzine 25 mg at bedtime for insomnia. Continue Wellbutrin XL 300 mg daily for depression. Continue Ambien 10 mg 1/2-1 tab as bedtime for insomnia. Continue Lyrica 200 mg 3 times daily for anxiety and mood stabilization.  Pt to follow-up in 3 months or sooner if clinically indicated.  Patient advised to contact office with any questions, adverse effects, or acute worsening in signs and symptoms.  Dalton was seen today for follow-up.  Diagnoses and all orders for this visit:  Attention deficit hyperactivity disorder (ADHD),  combined type -     ADDERALL XR 20 MG 24 hr capsule; Take 1 capsule (20 mg total) by mouth daily. -     amphetamine-dextroamphetamine (ADDERALL XR) 20 MG 24 hr capsule; Take 1 capsule (20 mg total) by mouth daily. -     amphetamine-dextroamphetamine (ADDERALL XR) 20 MG 24 hr capsule; Take 1 capsule (20 mg total) by mouth daily.  PTSD (post-traumatic stress disorder) -     brexpiprazole (REXULTI) 2 MG TABS tablet; Take 1 tablet (2 mg total) by mouth daily. -     busPIRone (BUSPAR) 15 MG tablet; Take 1 tablet (15 mg total) by mouth 2 (two) times daily. -     pregabalin (LYRICA) 200 MG capsule; Take 1 capsule (200 mg total) by mouth 3 (three) times daily.  Bipolar affective disorder, remission status unspecified (HCC) -     brexpiprazole (REXULTI) 2 MG TABS tablet; Take 1 tablet (2 mg total) by mouth daily. -     buPROPion (WELLBUTRIN XL) 300 MG 24 hr tablet; TAKE 1 TABLET BY MOUTH ONCE DAILY IN THE MORNING -     pregabalin (LYRICA) 200 MG capsule; Take 1 capsule (200 mg total) by mouth 3 (three) times daily.  GAD (generalized anxiety disorder) -     hydrOXYzine (ATARAX) 25 MG tablet; TAKE 1 TABLET BY MOUTH EVERYDAY AT BEDTIME  Insomnia, unspecified type -     hydrOXYzine (ATARAX) 25 MG tablet; TAKE 1 TABLET BY MOUTH EVERYDAY AT BEDTIME -     zolpidem (AMBIEN) 10 MG tablet; TAKE 1/2-1 TABLET BY MOUTH AT BEDTIME     Please see After Visit Summary for patient specific instructions.  Future Appointments  Date Time Provider Department Center  07/16/2023  8:30 AM Corie Chiquito, PMHNP CP-CP None    No orders of the defined types were placed in this encounter.   -------------------------------

## 2023-04-16 ENCOUNTER — Telehealth: Payer: Self-pay

## 2023-04-16 ENCOUNTER — Telehealth: Payer: Self-pay | Admitting: Psychiatry

## 2023-04-16 NOTE — Telephone Encounter (Signed)
Prior Authorization Adderall XR 20MG  er capsules #30/30 [employer]

## 2023-04-16 NOTE — Telephone Encounter (Signed)
I initiated the PA for brand Adderall.  You did a PA 08/06/22 for brand. It has now expired. I didn't see in notes reason for needing brand. Will probably need notes.

## 2023-04-16 NOTE — Telephone Encounter (Signed)
Pt LVM 5/14 @ 5:32p.  She said the insurance won't cover Adderall XR 20mg .  She said Shanda Bumps knows she can't take generic.  She thinks a PA needs to be done in order for to get brand name.  Next appt 8/14

## 2023-04-16 NOTE — Telephone Encounter (Signed)
There were 2 PA's initiated on the 15th for Brand Adderall XR I submitted one of them and the other is open, will archive once determination noted.

## 2023-04-16 NOTE — Telephone Encounter (Signed)
Prior Approval received effective through 12/02/2023 for BRAND Adderall XR 20 mg, PA #[id number] with [facility]

## 2023-04-16 NOTE — Telephone Encounter (Signed)
Info left on VM per DPR.  

## 2023-04-16 NOTE — Telephone Encounter (Signed)
PA initiated

## 2023-05-04 NOTE — Telephone Encounter (Signed)
No message needed °

## 2023-07-16 ENCOUNTER — Ambulatory Visit: Payer: Medicare HMO | Admitting: Psychiatry

## 2023-08-20 ENCOUNTER — Ambulatory Visit: Payer: Medicare HMO | Admitting: Psychiatry

## 2023-08-20 ENCOUNTER — Telehealth: Payer: Self-pay | Admitting: Psychiatry

## 2023-08-20 DIAGNOSIS — F319 Bipolar disorder, unspecified: Secondary | ICD-10-CM

## 2023-08-20 DIAGNOSIS — G4700 Insomnia, unspecified: Secondary | ICD-10-CM

## 2023-08-20 DIAGNOSIS — F411 Generalized anxiety disorder: Secondary | ICD-10-CM

## 2023-08-20 DIAGNOSIS — F4310 Post-traumatic stress disorder, unspecified: Secondary | ICD-10-CM

## 2023-08-20 MED ORDER — BREXPIPRAZOLE 2 MG PO TABS
2.0000 mg | ORAL_TABLET | Freq: Every day | ORAL | 0 refills | Status: AC
Start: 2023-08-20 — End: ?

## 2023-08-20 MED ORDER — BUSPIRONE HCL 15 MG PO TABS
15.0000 mg | ORAL_TABLET | Freq: Two times a day (BID) | ORAL | 0 refills | Status: AC
Start: 2023-08-20 — End: ?

## 2023-08-20 MED ORDER — HYDROXYZINE HCL 25 MG PO TABS
ORAL_TABLET | ORAL | 0 refills | Status: AC
Start: 2023-08-20 — End: ?

## 2023-08-20 MED ORDER — BUPROPION HCL ER (XL) 300 MG PO TB24
ORAL_TABLET | ORAL | 0 refills | Status: AC
Start: 2023-08-20 — End: ?

## 2023-08-20 NOTE — Telephone Encounter (Signed)
Sent non-controlled RF. She has RF on Lyrica, last filled 9/12. LF Zolpidem 8/8, Adderall 7/10.

## 2023-08-20 NOTE — Telephone Encounter (Signed)
Julie Abbott's next appt is 09/04/23. [name] is requesting refills on Adderall XR20 mg, Rexulti 2 mg, Wellbutrin XL 300 mg, Buspar 15 mg, Atarax 25 mg, Lyrica 200 mg and Ambien 10 mg. Pharmacy is:  [id number] [place], [place] - [address] [facility] [address]   Phone: [phone]  Fax: [phone]   There in stock.

## 2023-08-21 ENCOUNTER — Other Ambulatory Visit: Payer: Self-pay

## 2023-08-21 DIAGNOSIS — F902 Attention-deficit hyperactivity disorder, combined type: Secondary | ICD-10-CM

## 2023-08-21 MED ORDER — AMPHETAMINE-DEXTROAMPHET ER 20 MG PO CP24
20.0000 mg | ORAL_CAPSULE | Freq: Every day | ORAL | 0 refills | Status: AC
Start: 2023-08-21 — End: ?

## 2023-08-21 NOTE — Telephone Encounter (Signed)
Adderall pended, has RF for Lyrica and Ambien.

## 2023-09-04 ENCOUNTER — Encounter: Payer: Self-pay | Admitting: Psychiatry

## 2023-09-04 ENCOUNTER — Ambulatory Visit (INDEPENDENT_AMBULATORY_CARE_PROVIDER_SITE_OTHER): Payer: 59 | Admitting: Psychiatry

## 2023-09-04 DIAGNOSIS — F319 Bipolar disorder, unspecified: Secondary | ICD-10-CM

## 2023-09-04 DIAGNOSIS — F4310 Post-traumatic stress disorder, unspecified: Secondary | ICD-10-CM | POA: Diagnosis not present

## 2023-09-04 DIAGNOSIS — G4700 Insomnia, unspecified: Secondary | ICD-10-CM | POA: Diagnosis not present

## 2023-09-04 DIAGNOSIS — F411 Generalized anxiety disorder: Secondary | ICD-10-CM | POA: Diagnosis not present

## 2023-09-04 DIAGNOSIS — F902 Attention-deficit hyperactivity disorder, combined type: Secondary | ICD-10-CM

## 2023-09-04 MED ORDER — BUPROPION HCL ER (XL) 300 MG PO TB24
ORAL_TABLET | ORAL | 0 refills | Status: DC
Start: 2023-09-04 — End: 2023-12-04

## 2023-09-04 MED ORDER — BREXPIPRAZOLE 2 MG PO TABS
2.0000 mg | ORAL_TABLET | Freq: Every day | ORAL | 0 refills | Status: DC
Start: 2023-09-04 — End: 2023-12-02

## 2023-09-04 MED ORDER — AMPHETAMINE-DEXTROAMPHET ER 20 MG PO CP24: 20.0000 mg | ORAL_CAPSULE | Freq: Every day | ORAL | 0 refills | Status: DC

## 2023-09-04 MED ORDER — ZOLPIDEM TARTRATE 10 MG PO TABS: ORAL_TABLET | ORAL | 3 refills | Status: DC

## 2023-09-04 MED ORDER — HYDROXYZINE HCL 25 MG PO TABS
ORAL_TABLET | ORAL | 0 refills | Status: DC
Start: 2023-09-04 — End: 2023-12-04

## 2023-09-04 MED ORDER — BUSPIRONE HCL 15 MG PO TABS
15.0000 mg | ORAL_TABLET | Freq: Two times a day (BID) | ORAL | 0 refills | Status: DC
Start: 2023-09-04 — End: 2023-12-02

## 2023-09-04 MED ORDER — AMPHETAMINE-DEXTROAMPHET ER 20 MG PO CP24
20.0000 mg | ORAL_CAPSULE | Freq: Every day | ORAL | 0 refills | Status: DC
Start: 2023-11-15 — End: 2024-05-31

## 2023-09-04 NOTE — Progress Notes (Signed)
Julie Abbott [id number] [birth year] 47 y.o.  Subjective:   Patient ID:  Julie Abbott is a 47 y.o. (DOB Jun 10, 1976) female.  Chief Complaint:  Chief Complaint  Patient presents with   ADHD   Follow-up    PTSD, Bipolar Disorder, and insomnia    HPI Julie Abbott presents to the office today for follow-up of PTSD, Bipolar, ADHD, and insomnia. She reports, "I've been feeling ok." She reports that her job is stressful. Denies panic. She reports that her anxiety typically manifests as anger. Reports anxiety with interactions outside of work. Has not noticed any depression. She reports adequate sleep aside from waking up to urinate.  She reports that Adderall is helping for part of the day and then no longer as effective. She takes Adderall around 5 am and then notices it wears off around 11 am and will feel anxious and have difficulty focusing. She reports frustration at times in response to others not performing their job duties. She reports, "I do everything I am supposed to." Energy and motivation are "alright." She reports eating less since she is busy at work. She reports that appetite has been decreased. Denies SI.   She has been at current job 5 months. She reports limited training.   Ambien last filled 08/23/23. X 2 Adderall XR- last filled 08/21/23.  Lyrica last filled 08/14/23 x 4  Past Psychiatric Medication Trials: Ritalin- Prescribed when she was 47 yo. Felt like a zombie.  Adderall- was taking before pain medication. Would have "crash" in the afternoon. Adderall XR Vyvanse- Ineffective Latuda- Was taking in [place] at 80 mg BID. Reports that Latuda seemed to decrease frequency of manic s/s. Depression was less. Denies side effects Vraylar- Ineffective Abilify Risperdal Geodon Seroquel- Excessive somnolence Lamictal- Tolerated ok. Had some arthralgias. Depakote Trileptal Wellbutrin- Has taken long-term. Was increased to 300 mg po qd in the last 1-2 years. Denies side  effects. Helps with decreasing smoking. Citalopram Cymbalta Buspar- Started by PCP in [place] after major stressor before moving to [place]. Thinks it is somewhat helpful for anxiety and rumination. Denies side effective. Ambien- Most effective for sleep.  Has taken for about 10-15 years Ambien CR Lunesta- ineffective. Melatonin Hydroxyzine Lyrica- Prescribed for pain, neuropathy, and mood stabilization.  Gabapentin-Adverse reaction Belsomra Trazodone- Adverse/paradoxical response   AIMS    Flowsheet Row Video Visit from 12/17/2022 in [facility] Office Visit from 10/17/2022 in [facility] Office Visit from 06/12/2022 in [facility] Office Visit from 10/02/2021 in [facility] Office Visit from 10/30/2020 in [facility]  AIMS Total Score 0 2 7 3 3         Review of Systems:  Review of Systems  Cardiovascular:  Negative for chest pain and palpitations.  Endocrine: Positive for polydipsia.  Genitourinary:  Positive for urgency.       Reports incontinence  Musculoskeletal:  Negative for gait problem.  Psychiatric/Behavioral:         Please refer to HPI   Reports that she is trying to establish care with a PCP  Medications: I have reviewed the patient's current medications.  Current Outpatient Medications  Medication Sig Dispense Refill   [START ON 09/20/2023] amphetamine-dextroamphetamine (ADDERALL XR) 20 MG 24 hr capsule Take 1 capsule (20 mg total) by mouth daily. 30 capsule 0   [START ON 10/18/2023] amphetamine-dextroamphetamine (ADDERALL XR) 20 MG 24 hr capsule Take 1 capsule (20 mg total) by mouth daily. 30  capsule 0   [START ON 11/15/2023] amphetamine-dextroamphetamine (ADDERALL XR) 20 MG 24 hr capsule Take 1 capsule (20 mg total) by mouth daily. 30 capsule 0   brexpiprazole (REXULTI) 2 MG TABS tablet Take 1 tablet (2 mg total) by mouth daily.  90 tablet 0   buPROPion (WELLBUTRIN XL) 300 MG 24 hr tablet TAKE 1 TABLET BY MOUTH ONCE DAILY IN THE MORNING 90 tablet 0   busPIRone (BUSPAR) 15 MG tablet Take 1 tablet (15 mg total) by mouth 2 (two) times daily. 180 tablet 0   estradiol (CLIMARA) 0.06 MG/24HR 1 patch once a week. (Patient not taking: Reported on 04/15/2023)     hydrOXYzine (ATARAX) 25 MG tablet TAKE 1 TABLET BY MOUTH EVERYDAY AT BEDTIME 90 tablet 0   insulin lispro (HUMALOG) 100 UNIT/ML KwikPen Inject into the skin.     LANTUS SOLOSTAR 100 UNIT/ML Solostar Pen Inject into the skin.     losartan-hydrochlorothiazide (HYZAAR) 50-12.5 MG tablet Take 1 tablet by mouth daily.     pregabalin (LYRICA) 200 MG capsule Take 1 capsule (200 mg total) by mouth 3 (three) times daily. 90 capsule 5   rosuvastatin (CRESTOR) 10 MG tablet Take by mouth. (Patient not taking: Reported on 04/15/2023)     [START ON 11/08/2023] zolpidem (AMBIEN) 10 MG tablet TAKE 1/2-1 TABLET BY MOUTH AT BEDTIME 30 tablet 3   No current facility-administered medications for this visit.    Medication Side Effects: None  Allergies:  Allergies  Allergen Reactions   Bee Venom    Gabapentin    Lisinopril Cough   Lortab [Hydrocodone-Acetaminophen]     Past Medical History:  Diagnosis Date   Back pain    Diabetes mellitus, type II (HCC)    Interstitial cystitis    PCOS (polycystic ovarian syndrome)     Past Medical History, Surgical history, Social history, and Family history were reviewed and updated as appropriate.   Please see review of systems for further details on the patient's review from today.   Objective:   Physical Exam:  BP (!) 158/102   Pulse (!) 107   Physical Exam Constitutional:      General: She is not in acute distress. Musculoskeletal:        General: No deformity.  Neurological:     Mental Status: She is alert and oriented to person, place, and time.     Coordination: Coordination normal.  Psychiatric:        Attention and  Perception: Attention and perception normal. She does not perceive auditory or visual hallucinations.        Mood and Affect: Mood normal. Mood is not anxious or depressed. Affect is not labile, blunt, angry or inappropriate.        Speech: Speech normal.        Behavior: Behavior normal.        Thought Content: Thought content normal. Thought content is not paranoid or delusional. Thought content does not include homicidal or suicidal ideation. Thought content does not include homicidal or suicidal plan.        Cognition and Memory: Cognition and memory normal.        Judgment: Judgment normal.     Comments: Insight intact     Lab Review:  No results found for: "NA", "K", "CL", "CO2", "GLUCOSE", "BUN", "CREATININE", "CALCIUM", "PROT", "ALBUMIN", "AST", "ALT", "ALKPHOS", "BILITOT", "GFRNONAA", "GFRAA"  No results found for: "WBC", "RBC", "HGB", "HCT", "PLT", "MCV", "MCH", "MCHC", "RDW", "LYMPHSABS", "MONOABS", "EOSABS", "BASOSABS"  No results found  for: "POCLITH", "LITHIUM"   No results found for: "PHENYTOIN", "PHENOBARB", "VALPROATE", "CBMZ"   .res Assessment: Plan:    Discussed not increasing Adderall XR until BP is controlled. Encouraged pt to establish care with PCP when able to do so.  Will continue Adderall Xr 20 mg daily for ADHD. Discussed considering taking medication around 7-8 am instead of 5 am, so that medication may be more effective during work hours.  Continue Rexulti 2 mg daily for mood.  Continue Wellbutrin XL 300 mg daily for depression.  Continue Buspar 15 mg po BID for anxiety.  Continue Hydroxyzine 25 mg at bedtime for anxiety and insomnia.  Continue Pregabalin 200 mg three times daily for anxiety and mood.  Continue Ambien 10 mg 1/2-1 tablet at bedtime for insomnia.  Pt to follow-up in 3 months or sooner if clinically indicated.  Patient advised to contact office with any questions, adverse effects, or acute worsening in signs and symptoms.   Somaya was seen  today for adhd and follow-up.  Diagnoses and all orders for this visit:  Bipolar affective disorder, remission status unspecified (HCC) -     brexpiprazole (REXULTI) 2 MG TABS tablet; Take 1 tablet (2 mg total) by mouth daily. -     buPROPion (WELLBUTRIN XL) 300 MG 24 hr tablet; TAKE 1 TABLET BY MOUTH ONCE DAILY IN THE MORNING  PTSD (post-traumatic stress disorder) -     brexpiprazole (REXULTI) 2 MG TABS tablet; Take 1 tablet (2 mg total) by mouth daily. -     busPIRone (BUSPAR) 15 MG tablet; Take 1 tablet (15 mg total) by mouth 2 (two) times daily.  GAD (generalized anxiety disorder) -     hydrOXYzine (ATARAX) 25 MG tablet; TAKE 1 TABLET BY MOUTH EVERYDAY AT BEDTIME  Insomnia, unspecified type -     hydrOXYzine (ATARAX) 25 MG tablet; TAKE 1 TABLET BY MOUTH EVERYDAY AT BEDTIME -     zolpidem (AMBIEN) 10 MG tablet; TAKE 1/2-1 TABLET BY MOUTH AT BEDTIME  Attention deficit hyperactivity disorder (ADHD), combined type -     amphetamine-dextroamphetamine (ADDERALL XR) 20 MG 24 hr capsule; Take 1 capsule (20 mg total) by mouth daily. -     amphetamine-dextroamphetamine (ADDERALL XR) 20 MG 24 hr capsule; Take 1 capsule (20 mg total) by mouth daily. -     amphetamine-dextroamphetamine (ADDERALL XR) 20 MG 24 hr capsule; Take 1 capsule (20 mg total) by mouth daily.     Please see After Visit Summary for patient specific instructions.  Future Appointments  Date Time Provider Department Center  12/04/2023  8:30 AM Corie Chiquito, PMHNP CP-CP None    No orders of the defined types were placed in this encounter.   -------------------------------

## 2023-10-14 ENCOUNTER — Other Ambulatory Visit: Payer: Self-pay

## 2023-10-14 ENCOUNTER — Other Ambulatory Visit: Payer: Self-pay | Admitting: Psychiatry

## 2023-10-14 DIAGNOSIS — F4310 Post-traumatic stress disorder, unspecified: Secondary | ICD-10-CM

## 2023-10-14 DIAGNOSIS — G4700 Insomnia, unspecified: Secondary | ICD-10-CM

## 2023-10-14 DIAGNOSIS — F319 Bipolar disorder, unspecified: Secondary | ICD-10-CM

## 2023-10-14 NOTE — Telephone Encounter (Signed)
The pharmacy has Adderall and Zolpidem, Pregabalin was pended by Shanda Bumps.  PT called but could not get through " your call can not be completed at this time, call back later"

## 2023-10-14 NOTE — Telephone Encounter (Signed)
Next visit is 12/04/23. Julie Abbott states that the below medications were never called in with the other ones. Ones that need refilled are Adderall 20 mg, pregabalin 200 mg, and Zolpidem 10 mg called to:  [id number] [place], [place] - [address] AT [facility] [facility]   Phone: [phone]  Fax: [phone]

## 2023-10-15 ENCOUNTER — Other Ambulatory Visit: Payer: Self-pay

## 2023-10-15 ENCOUNTER — Encounter: Payer: Self-pay | Admitting: Psychiatry

## 2023-10-15 NOTE — Telephone Encounter (Signed)
When I tried to rf the lyrica, it said that it had already been pended by [place].

## 2023-10-15 NOTE — Telephone Encounter (Signed)
I don't believe Jessica pended the Lyrica, there wouldn't be any reason why she would do that. The request came through on interface from the pharmacy.

## 2023-10-15 NOTE — Telephone Encounter (Signed)
Script sent  

## 2023-10-15 NOTE — Telephone Encounter (Signed)
This is the msg I get.   " This order already has pended reorders.       pregabalin (LYRICA) 200 MG capsule (Order [id number]) was reordered and pended by Corie Chiquito, PMHNP

## 2023-10-23 ENCOUNTER — Other Ambulatory Visit: Payer: Self-pay | Admitting: Psychiatry

## 2023-10-23 DIAGNOSIS — G4700 Insomnia, unspecified: Secondary | ICD-10-CM

## 2023-12-01 NOTE — Progress Notes (Signed)
 Ellouise Console, PA-C [address]  [address]  [place], [place] [address]  Main: [phone]  Fax: [phone]   Gastroenterology Consultation  Referring Provider:     Gwen Fallow, MD Primary Care Physician:  Gwen Fallow, MD Primary Gastroenterologist:  Ellouise Console, PA-C  Reason for Consultation:     Fecal leakage; Irregular Stools        HPI:   Julie Abbott is a 47 y.o. y/o female referred for consultation & management  by Gwen Fallow, MD.    Patient is self-referred to evaluate uncontrollable fecal leakage.  Patient states she has had chronic GI symptoms all of her life.  Had chronic constipation in the past.  For the past year she has had more loose stools.  Irregular bowel habits.  Sometimes formed, sometimes loose, mostly soft.  She currently has 3 or 4 bowel movements daily.  She has episodes of fecal incontinence with no warning.  This has been going on for 1 year.  She denies rectal bleeding or abdominal pain at present.  She has had intermittent abdominal cramping in the past.  She denies watery or nocturnal diarrhea.  She reports seeing gastroenterologist Dr. Towana in [place] 5 years ago.  He reportedly gave her medicine for constipation which she discontinued due to fecal incontinence.  She reports seeing a gastroenterologist in [facility]  10 years ago.  She reports having a colonoscopy and EGD 10 years ago, results unavailable.  She was told she had hiatal hernia and colonoscopy was normal.  She states she has chronic GERD for many years.  Currently controlled on Protonix  40 mg and famotidine 10 Mg daily.  She denies any dysphagia symptoms at present.  11/2022: Screening Cologuard negative.  12/2021: Barium swallow with tablet to evaluate chronic dysphagia: Normal.  Lower cervical plate with screw fixator in place.  No GERD, hiatal hernia, or stricture.  Medical history significant for bipolar disorder, chronic constipation, diabetes,  GERD, hypertension, COPD, PTSD, history of failed neck fusion, obesity.  Previous hysterectomy and spine surgery.  She has seen family medicine and endocrinologist for through [facility].  Last follow-up with endocrinology 01/2023.  She had hospitalization for pancreatitis caused by Ozempic.  Since then she has discontinued all diabetic medications.  Only takes insulin .  She is currently trying to establish care with a new local PCP and endocrinologist.  History of diabetes since age 80.  Past Medical History:  Diagnosis Date   Back pain    Bipolar affective disorder (HCC) 07/18/2021   Cervical spondylosis with myelopathy and radiculopathy 11/10/2021   Chronic bilateral low back pain 07/18/2021   Constipation by outlet dysfunction 04/06/2021   Last Assessment & Plan:    CHRONIC CONSTIPATION WITH RECENT ONSET OF VOMITING   ?? Blockage/ unknown etiology     LAB RESULTS FROM 04/03/21  -- WBC 5.3  Monitor diet, avoid heavy meals, recommend soft foods, liquid diet until eval by GI  Upcoming GI appt pendig  Advised if develop pain, fever, increase/ worsening symptoms, go to ER     Diabetes mellitus, type II (HCC)    Diabetic polyneuropathy associated with type 2 diabetes mellitus (HCC) 04/06/2021   H/O total hysterectomy 12/10/2021   Hirsutism 04/11/1995   History of fusion of cervical spine 04/10/2022   Interstitial cystitis    Mixed hyperlipidemia 07/18/2021   Other insomnia 07/18/2021   PCOS (polycystic ovarian syndrome)    PTSD (post-traumatic stress disorder) 07/18/2021   Type 2 diabetes mellitus with complication,  with long-term current use of insulin  (HCC) 07/18/2021    Past Surgical History:  Procedure Laterality Date   ABDOMINAL HYSTERECTOMY     SPINAL FUSION      Prior to Admission medications   Medication Sig Start Date End Date Taking? Authorizing Provider  amphetamine -dextroamphetamine (ADDERALL  XR) 20 MG 24 hr capsule Take 1 capsule (20 mg total) by mouth daily. 09/20/23   Franchot Raisin, PMHNP  amphetamine -dextroamphetamine (ADDERALL  XR) 20 MG 24 hr capsule Take 1 capsule (20 mg total) by mouth daily. 10/18/23   Franchot Raisin, PMHNP  amphetamine -dextroamphetamine (ADDERALL  XR) 20 MG 24 hr capsule Take 1 capsule (20 mg total) by mouth daily. 11/15/23   Franchot Raisin, PMHNP  brexpiprazole  (REXULTI ) 2 MG TABS tablet Take 1 tablet (2 mg total) by mouth daily. 09/04/23   Franchot Raisin, PMHNP  buPROPion  (WELLBUTRIN  XL) 300 MG 24 hr tablet TAKE 1 TABLET BY MOUTH ONCE DAILY IN THE MORNING 09/04/23   Franchot Raisin, PMHNP  busPIRone  (BUSPAR ) 15 MG tablet Take 1 tablet (15 mg total) by mouth 2 (two) times daily. 09/04/23   Franchot Raisin, PMHNP  estradiol (CLIMARA) 0.06 MG/24HR 1 patch once a week. Patient not taking: Reported on 04/15/2023 03/07/20   [provider]  hydrOXYzine  (ATARAX ) 25 MG tablet TAKE 1 TABLET BY MOUTH EVERYDAY AT BEDTIME 09/04/23   Franchot Raisin, PMHNP  insulin  lispro (HUMALOG ) 100 UNIT/ML KwikPen Inject into the skin. 01/24/23 01/24/24  [provider]  LANTUS  SOLOSTAR 100 UNIT/ML Solostar Pen Inject into the skin. 01/24/23 01/24/24  [provider]  losartan -hydrochlorothiazide (HYZAAR) 50-12.5 MG tablet Take 1 tablet by mouth daily. 10/14/22 10/14/23  [provider]  pregabalin  (LYRICA ) 200 MG capsule TAKE 1 CAPSULE (200 MG TOTAL) BY MOUTH 3 (THREE) TIMES DAILY. 10/15/23   Franchot Raisin, PMHNP  rosuvastatin (CRESTOR) 10 MG tablet Take by mouth. Patient not taking: Reported on 04/15/2023 07/01/19   [provider]  zolpidem  (AMBIEN ) 10 MG tablet TAKE 1/2 TO 1 TABLET BY MOUTH AT BEDTIME 10/24/23   Franchot Raisin, PMHNP    Family History  Problem Relation Age of Onset   Bipolar disorder Daughter    Anxiety disorder Daughter    Depression Mother    Alcohol abuse Mother    Drug abuse Mother    OCD Mother    Panic disorder Mother    Alcohol abuse Maternal Aunt    Polycystic ovary syndrome Paternal Aunt     Alcohol abuse Maternal Uncle    Alcohol abuse Maternal Grandmother    Anxiety disorder Maternal Grandmother    Depression Maternal Grandmother    Polycystic ovary syndrome Paternal Grandmother      Social History   Tobacco Use   Smoking status: Every Day    Current packs/day: 0.25    Types: Cigarettes   Smokeless tobacco: Never  Substance Use Topics   Alcohol use: Never   Drug use: Never    Allergies as of 12/02/2023 - Review Complete 12/02/2023  Allergen Reaction Noted   Bee venom  03/13/2020   Gabapentin  03/13/2020   Lisinopril Cough 12/17/2022   Lortab [hydrocodone-acetaminophen]  03/13/2020   Ozempic (0.25 or 0.5 mg-dose) [semaglutide(0.25 or 0.5mg -dos)] Other (See Comments) 12/02/2023    Review of Systems:    All systems reviewed and negative except where noted in HPI.   Physical Exam:  BP 131/78   Pulse (!) 137   Temp 98.1 F (36.7 C)   Ht 5' 4 (1.626 m)   Wt 173  lb (78.5 kg)   BMI 29.70 kg/m  No LMP recorded.  General:   Alert,  Well-developed, well-nourished, pleasant and cooperative in NAD Lungs:  Respirations even and unlabored.  Clear throughout to auscultation.   No wheezes, crackles, or rhonchi. No acute distress. Heart:  Regular rate and rhythm; no murmurs, clicks, rubs, or gallops. Abdomen:  Normal bowel sounds.  No bruits.  Soft, and non-distended without masses, hepatosplenomegaly or hernias noted.  No Tenderness.  No guarding or rebound tenderness.    Neurologic:  Alert and oriented x3;  grossly normal neurologically. Psych:  Alert and cooperative. Normal mood and affect.  Imaging Studies: No results found.  Assessment and Plan:   Julie Abbott is a 47 y.o. y/o female has been referred for:   1.  Loose stools / Irregular bowel habits; chronic lifelong GI symptoms for many years suspicious for irritable bowel syndrome.  Previous history of constipation.  Labs: CBC, CMP, TSH, free T4, celiac panel  2.  Fecal incontinence: Has loose and  formed stools.  Start FiberCon tablets, 2 tablets once or twice daily.  Drink 64 ounces of fluids daily.  Pending lab results and symptoms, if loose stools persist, then check fecal elastase for EPI given her history of diabetes and pancreatitis.  Also consider trying Creon or Zenpep pancreas enzymes.  3.  GERD - Chronic for many years; Controlled on current meds. Continue Protonix  40 mg daily and famotidine 10 Mg as needed.  Recommend Lifestyle Modifications to prevent Acid Reflux.  Rec. Avoid coffee, sodas, peppermint, garlic, onions, alcohol, citrus fruits, chocolate, tomatoes, fatty and spicey foods.  Avoid eating 2-3 hours before bedtime.    4.  Colon cancer screening  She had a negative Cologuard in 2023.  At her next follow-up visit discusS scheduling colonoscopy.  She has been off diabetic medications for 1 year.  Need to make sure glucose is under control before scheduling colonoscopy.  She is in process of establishing with a new PCP, next appointment 01/2023.  5.  Type 2 diabetes, on insulin   Lab: hemoglobin A1c  She stopped all diabetic medications 1 year ago except for insulin .  Ozempic caused pancreatitis.  Needs to establish with new PCP for management of diabetes.  Follow up in 5 weeks to discuss scheduling colonoscopy and possible EGD.  Ellouise Console, PA-C

## 2023-12-02 ENCOUNTER — Ambulatory Visit: Payer: 59 | Admitting: Physician Assistant

## 2023-12-02 ENCOUNTER — Encounter: Payer: Self-pay | Admitting: Physician Assistant

## 2023-12-02 VITALS — BP 131/78 | HR 137 | Temp 98.1°F | Ht 64.0 in | Wt 173.0 lb

## 2023-12-02 DIAGNOSIS — K219 Gastro-esophageal reflux disease without esophagitis: Secondary | ICD-10-CM | POA: Diagnosis not present

## 2023-12-02 DIAGNOSIS — Z794 Long term (current) use of insulin: Secondary | ICD-10-CM

## 2023-12-02 DIAGNOSIS — R159 Full incontinence of feces: Secondary | ICD-10-CM | POA: Diagnosis not present

## 2023-12-02 DIAGNOSIS — R195 Other fecal abnormalities: Secondary | ICD-10-CM

## 2023-12-02 DIAGNOSIS — E118 Type 2 diabetes mellitus with unspecified complications: Secondary | ICD-10-CM

## 2023-12-02 DIAGNOSIS — E119 Type 2 diabetes mellitus without complications: Secondary | ICD-10-CM | POA: Diagnosis not present

## 2023-12-02 LAB — COMPREHENSIVE METABOLIC PANEL: eGFR: 89

## 2023-12-02 NOTE — Patient Instructions (Signed)
fib

## 2023-12-03 NOTE — Progress Notes (Signed)
 Call and tell patient labs show ALT emergency department: 1.  Normal CBC.  White count and hemoglobin are normal.  No anemia. 2.  Her glucose and hemoglobin A1c are very elevated.  Glucose 457.  Hemoglobin A1c 11.7 (goal less than 6.5).  It is extremely important that she establish care with new PCP and endocrinologist as soon as possible to get back on diabetic medication and get her sugars under control.  We cannot schedule any procedures until her sugar has improved.  If she is feeling weak, or poorly, then she should go to ED for treatment of diabetic ketoacidosis and IV fluids. 3.  Sodium is low, potassium is mildly elevated, chloride low.  Please drink 64 ounces of water daily.  Needs repeat BMP lab in 2 weeks.  Avoid potassium supplements or potassium rich foods such as bananas, orange juice, tomatoes. 4.  AST and ALT liver tests are elevated.  Please avoid alcohol and Tylenol.  We will repeat her liver tests at next follow-up office visit. 5.  Celiac labs are pending.  Will let her know once we get those results. Ellouise Console, PA-C

## 2023-12-04 ENCOUNTER — Ambulatory Visit: Payer: Medicare HMO | Admitting: Psychiatry

## 2023-12-04 ENCOUNTER — Encounter: Payer: Self-pay | Admitting: Psychiatry

## 2023-12-04 ENCOUNTER — Telehealth: Payer: Self-pay

## 2023-12-04 DIAGNOSIS — F411 Generalized anxiety disorder: Secondary | ICD-10-CM

## 2023-12-04 DIAGNOSIS — G4700 Insomnia, unspecified: Secondary | ICD-10-CM

## 2023-12-04 DIAGNOSIS — F319 Bipolar disorder, unspecified: Secondary | ICD-10-CM

## 2023-12-04 DIAGNOSIS — F902 Attention-deficit hyperactivity disorder, combined type: Secondary | ICD-10-CM | POA: Diagnosis not present

## 2023-12-04 MED ORDER — FANAPT 1 MG PO TABS: ORAL_TABLET | ORAL | 0 refills | Status: DC

## 2023-12-04 MED ORDER — BUPROPION HCL ER (XL) 300 MG PO TB24: ORAL_TABLET | ORAL | 0 refills | Status: DC

## 2023-12-04 MED ORDER — HYDROXYZINE HCL 25 MG PO TABS: ORAL_TABLET | ORAL | 0 refills | Status: DC

## 2023-12-04 MED ORDER — ZOLPIDEM TARTRATE 10 MG PO TABS: ORAL_TABLET | ORAL | 2 refills | Status: DC

## 2023-12-04 MED ORDER — AMPHETAMINE-DEXTROAMPHET ER 20 MG PO CP24: 20.0000 mg | ORAL_CAPSULE | Freq: Every day | ORAL | 0 refills | Status: DC

## 2023-12-04 NOTE — Progress Notes (Signed)
 Julie Abbott [id number] 12-23-1975 48 y.o.  Subjective:   Patient ID:  Julie Abbott is a 47 y.o. (DOB 1975/12/06) female.  Chief Complaint:  Chief Complaint  Patient presents with   Anxiety   Manic Behavior   Sleeping Problem    HPI Julie Abbott presents to the office today for follow-up of anxiety, Bipolar disorder, ADHD,  I'm stressed, I'm anxious, I'm overwhelmed. She reports that her chest starts hurting when she thinks about work. She reports that she feels on the verge of panic frequently. She reports worry and anxious thoughts. She has been smoking more. She reports that she has difficulty with concentration and staying organized. 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 She notices some manic symptoms since stress throws me into a tizzy. She reports that she has been irritable and aggressive at times, ie. Questioning things that she does not think are appropriate. She reports that she has energy and motivation while working and when [employer] not working, [employer] tired. She typically goes to bed at 1 am and gets up at 4 am to do work. She reports that she has not been eating regularly. Denies SI.   She will be at her job for a year in April. 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 She reports working yesterday over the holiday to try to not be as behind. She reports working long hours and weekends.   She reports that she stopped Rexulti  since she noticed some fatigue and low motivation.  Ambien  last filled 11/23/23 x 2.  Pregabalin  last filled 11/15/23 x 2 Adderall  last filled 10/23/23  Past Psychiatric Medication Trials: Ritalin- Prescribed when she was 48 yo. Felt like a zombie.  Adderall - was taking before pain medication. Would have crash in the afternoon. Adderall  XR Vyvanse- Ineffective Latuda - Was taking in [place] at 80 mg BID. Reports that Latuda  seemed to decrease frequency of manic s/s. Depression was less. Denies side effects Vraylar-  Ineffective Abilify Risperdal Geodon Seroquel- Excessive somnolence Lamictal- Tolerated ok. Had some arthralgias. Depakote Trileptal Wellbutrin - Has taken long-term. Was increased to 300 mg po qd in the last 1-2 years. Denies side effects. Helps with decreasing smoking. Citalopram Cymbalta Buspar - Started by PCP in [place] after major stressor before moving to [place]. Thinks it is somewhat helpful for anxiety and rumination. Denies side effective. Ambien - Most effective for sleep.  Has taken for about 10-15 years Ambien  CR Lunesta- ineffective. Melatonin Hydroxyzine  Lyrica - Prescribed for pain, neuropathy, and mood stabilization.  Gabapentin-Adverse reaction Belsomra  Trazodone- Adverse/paradoxical response  AIMS    Flowsheet Row Office Visit from 12/04/2023 in [facility] Video Visit from 12/17/2022 in [facility] Office Visit from 10/17/2022 in [facility] Office Visit from 06/12/2022 in [facility] Office Visit from 10/02/2021 in [facility]  AIMS Total Score 2 0 2 7 3         Review of Systems:  Review of Systems  Musculoskeletal:  Negative for gait problem.       Occ leg cramps which she attributes to walking and taking the stairs more.  Neurological:  Positive for tremors.  Psychiatric/Behavioral:         Please refer to HPI    Medications: I have reviewed the patient's current medications.  Current Outpatient Medications  Medication Sig Dispense Refill   Iloperidone  (FANAPT ) 1 MG TABS Take 1 tablet at bedtime for 4 nights, then may increase to 2 tablets  at bedtime if needed/tolerated 28 tablet 0   Iloperidone  (FANAPT ) 1 MG TABS Take 1-2 tablets at bedtime 60 tablet 0   pregabalin  (LYRICA ) 200 MG capsule TAKE 1 CAPSULE (200 MG TOTAL) BY MOUTH 3 (THREE) TIMES DAILY. 90 capsule 5   amphetamine -dextroamphetamine (ADDERALL  XR) 20 MG 24 hr  capsule Take 1 capsule (20 mg total) by mouth daily. 30 capsule 0   amphetamine -dextroamphetamine (ADDERALL  XR) 20 MG 24 hr capsule Take 1 capsule (20 mg total) by mouth daily. 30 capsule 0   [START ON 01/01/2024] amphetamine -dextroamphetamine (ADDERALL  XR) 20 MG 24 hr capsule Take 1 capsule (20 mg total) by mouth daily. 30 capsule 0   buPROPion  (WELLBUTRIN  XL) 300 MG 24 hr tablet TAKE 1 TABLET BY MOUTH ONCE DAILY IN THE MORNING 90 tablet 0   hydrOXYzine  (ATARAX ) 25 MG tablet TAKE 1 TABLET BY MOUTH EVERYDAY AT BEDTIME 90 tablet 0   insulin  lispro (HUMALOG ) 100 UNIT/ML KwikPen Inject into the skin. (Patient not taking: Reported on 12/04/2023)     LANTUS  SOLOSTAR 100 UNIT/ML Solostar Pen Inject into the skin. (Patient not taking: Reported on 12/04/2023)     [START ON 01/18/2024] zolpidem  (AMBIEN ) 10 MG tablet TAKE 1/2 TO 1 TABLET BY MOUTH AT BEDTIME 30 tablet 2   No current facility-administered medications for this visit.    Medication Side Effects: None  Allergies:  Allergies  Allergen Reactions   Bee Venom    Gabapentin    Lisinopril Cough   Lortab [Hydrocodone-Acetaminophen]    Ozempic (0.25 Or 0.5 Mg-Dose) [Semaglutide(0.25 Or 0.5mg -Dos)] Other (See Comments)    Acute pancreatitis    Past Medical History:  Diagnosis Date   Back pain    Bipolar affective disorder (HCC) 07/18/2021   Cervical spondylosis with myelopathy and radiculopathy 11/10/2021   Chronic bilateral low back pain 07/18/2021   Constipation by outlet dysfunction 04/06/2021   Last Assessment & Plan:    CHRONIC CONSTIPATION WITH RECENT ONSET OF VOMITING   ?? Blockage/ unknown etiology     LAB RESULTS FROM 04/03/21  -- WBC 5.3  Monitor diet, avoid heavy meals, recommend soft foods, liquid diet until eval by GI  Upcoming GI appt pendig  Advised if develop pain, fever, increase/ worsening symptoms, go to ER     Diabetes mellitus, type II (HCC)    Diabetic polyneuropathy associated with type 2 diabetes mellitus (HCC) 04/06/2021    H/O total hysterectomy 12/10/2021   Hirsutism 04/11/1995   History of fusion of cervical spine 04/10/2022   Interstitial cystitis    Mixed hyperlipidemia 07/18/2021   Other insomnia 07/18/2021   PCOS (polycystic ovarian syndrome)    PTSD (post-traumatic stress disorder) 07/18/2021   Type 2 diabetes mellitus with complication, with long-term current use of insulin  (HCC) 07/18/2021    Past Medical History, Surgical history, Social history, and Family history were reviewed and updated as appropriate.   Please see review of systems for further details on the patient's review from today.   Objective:   Physical Exam:  BP (!) 141/93   Pulse (!) 113   Physical Exam Constitutional:      General: She is not in acute distress. Musculoskeletal:        General: No deformity.  Neurological:     Mental Status: She is alert and oriented to person, place, and time.     Coordination: Coordination normal.  Psychiatric:        Attention and Perception: Attention and perception normal. She does not perceive auditory or  visual hallucinations.        Mood and Affect: Mood is anxious. Mood is not depressed. Affect is not labile, blunt, angry or inappropriate.        Speech: Speech normal.        Behavior: Behavior normal.        Thought Content: Thought content normal. Thought content is not paranoid or delusional. Thought content does not include homicidal or suicidal ideation. Thought content does not include homicidal or suicidal plan.        Cognition and Memory: Cognition and memory normal.        Judgment: Judgment normal.     Comments: Insight intact Dysphoric mood     Lab Review:     Component Value Date/Time   NA 132 (L) 12/02/2023 0859   K 5.4 (H) 12/02/2023 0859   CL 90 (L) 12/02/2023 0859   CO2 18 (L) 12/02/2023 0859   GLUCOSE 457 (H) 12/02/2023 0859   BUN 6 12/02/2023 0859   CREATININE 0.81 12/02/2023 0859   CALCIUM 9.6 12/02/2023 0859   PROT 7.1 12/02/2023 0859    ALBUMIN 4.6 12/02/2023 0859   AST 93 (H) 12/02/2023 0859   ALT 91 (H) 12/02/2023 0859   ALKPHOS 99 12/02/2023 0859   BILITOT 0.6 12/02/2023 0859       Component Value Date/Time   WBC 6.6 12/02/2023 0859   RBC 4.83 12/02/2023 0859   HGB 14.9 12/02/2023 0859   HCT 46.9 (H) 12/02/2023 0859   PLT 204 12/02/2023 0859   MCV 97 12/02/2023 0859   MCH 30.8 12/02/2023 0859   MCHC 31.8 12/02/2023 0859   RDW 12.1 12/02/2023 0859   LYMPHSABS 2.7 12/02/2023 0859   EOSABS 0.1 12/02/2023 0859   BASOSABS 0.1 12/02/2023 0859    No results found for: POCLITH, LITHIUM   No results found for: PHENYTOIN, PHENOBARB, VALPROATE, CBMZ   .res Assessment: Plan:    Discussed potential benefits, risks, and side effects of Fanapt  for mood stabilization, anxiety, and insomnia. Reviewed potential adverse effects with atypical antipsychotics. Discussed that Fanapt  has less risk of metabolic side effects compared to many other atypical antipsychotics. Pt agrees to trial of Fanapt . Will start Fanapt  1 mg at bedtime. Discussed that she could increase dose to 2 mg at bedtime if needed/as tolerated.  Reviewed provider's comments regarding lab results and encouraged pt to follow-up with medical providers as soon as possible regarding glucose levels. Will continue Wellbutrin  XL 300 mg daily for depression.  Continue Hydroxyzine  25 mg at bedtime for insomnia and anxiety.  Continue Ambien  10 mg 1/2-1 tablet at bedtime for insomnia. Continue Adderall  XR 20 mg daily for ADHD.  Discussed transferring care to another provider at [facility] since provider is leaving the practice. Pt to follow-up with Redell Pizza, NP in 4 weeks or sooner if clinically indicated.  Patient advised to contact office with any questions, adverse effects, or acute worsening in signs and symptoms.    Julie Abbott was seen today for anxiety, manic behavior and sleeping problem.  Diagnoses and all orders for this visit:  Bipolar affective  disorder, remission status unspecified (HCC) -     Iloperidone  (FANAPT ) 1 MG TABS; Take 1 tablet at bedtime for 4 nights, then may increase to 2 tablets at bedtime if needed/tolerated -     buPROPion  (WELLBUTRIN  XL) 300 MG 24 hr tablet; TAKE 1 TABLET BY MOUTH ONCE DAILY IN THE MORNING -     Iloperidone  (FANAPT ) 1 MG TABS; Take 1-2 tablets  at bedtime  Attention deficit hyperactivity disorder (ADHD), combined type -     amphetamine -dextroamphetamine (ADDERALL  XR) 20 MG 24 hr capsule; Take 1 capsule (20 mg total) by mouth daily.  GAD (generalized anxiety disorder) -     hydrOXYzine  (ATARAX ) 25 MG tablet; TAKE 1 TABLET BY MOUTH EVERYDAY AT BEDTIME  Insomnia, unspecified type -     hydrOXYzine  (ATARAX ) 25 MG tablet; TAKE 1 TABLET BY MOUTH EVERYDAY AT BEDTIME -     zolpidem  (AMBIEN ) 10 MG tablet; TAKE 1/2 TO 1 TABLET BY MOUTH AT BEDTIME     Please see After Visit Summary for patient specific instructions.  Future Appointments  Date Time Provider Department Center  12/25/2023  7:40 AM Gareth Mliss FALCON, FNP [facility]  12/31/2023  2:15 PM [place], PA-C [facility] None  01/01/2024 10:00 AM Teresa Redell LABOR, NP [facility] None  01/05/2024  8:30 AM Gaston Hamilton, MD [facility] None  02/11/2024  8:40 AM Bernardo Fend, DO [facility]    No orders of the defined types were placed in this encounter.   -------------------------------

## 2023-12-04 NOTE — Telephone Encounter (Signed)
 Left message to call office as soon as possible.   Call and tell patient labs show ALT emergency department:  1.  Normal CBC.  White count and hemoglobin are normal.  No anemia.  2.  Her glucose and hemoglobin A1c are very elevated.  Glucose 457.  Hemoglobin A1c 11.7 (goal less than 6.5).  It is extremely important that she establish care with new PCP and endocrinologist as soon as possible to get back on diabetic medication and get her sugars under control.  We cannot schedule any procedures until her sugar has improved.  If she is feeling weak, or poorly, then she should go to ED for treatment of diabetic ketoacidosis and IV fluids.  3.  Sodium is low, potassium is mildly elevated, chloride low.  Please drink 64 ounces of water daily.  Needs repeat BMP lab in 2 weeks.  Avoid potassium supplements or potassium rich foods such as bananas, orange juice, tomatoes.  4.  AST and ALT liver tests are elevated.  Please avoid alcohol and Tylenol.  We will repeat her liver tests at next follow-up office visit.  5.  Celiac labs are pending.  Will let her know once we get those results.  Ellouise Console, PA-C

## 2023-12-05 LAB — CBC WITH DIFFERENTIAL/PLATELET
Basophils Absolute: 0.1 10*3/uL (ref 0.0–0.2)
Basos: 1 %
EOS (ABSOLUTE): 0.1 10*3/uL (ref 0.0–0.4)
Eos: 1 %
Hematocrit: 46.9 % — ABNORMAL HIGH (ref 34.0–46.6)
Hemoglobin: 14.9 g/dL (ref 11.1–15.9)
Immature Grans (Abs): 0 10*3/uL (ref 0.0–0.1)
Immature Granulocytes: 0 %
Lymphocytes Absolute: 2.7 10*3/uL (ref 0.7–3.1)
Lymphs: 40 %
MCH: 30.8 pg (ref 26.6–33.0)
MCHC: 31.8 g/dL (ref 31.5–35.7)
MCV: 97 fL (ref 79–97)
Monocytes Absolute: 0.4 10*3/uL (ref 0.1–0.9)
Monocytes: 5 %
Neutrophils Absolute: 3.5 10*3/uL (ref 1.4–7.0)
Neutrophils: 53 %
Platelets: 204 10*3/uL (ref 150–450)
RBC: 4.83 x10E6/uL (ref 3.77–5.28)
RDW: 12.1 % (ref 11.7–15.4)
WBC: 6.6 10*3/uL (ref 3.4–10.8)

## 2023-12-05 LAB — COMPREHENSIVE METABOLIC PANEL
ALT: 91 [IU]/L — ABNORMAL HIGH (ref 0–32)
AST: 93 [IU]/L — ABNORMAL HIGH (ref 0–40)
Albumin: 4.6 g/dL (ref 3.9–4.9)
Alkaline Phosphatase: 99 [IU]/L (ref 44–121)
BUN/Creatinine Ratio: 7 — ABNORMAL LOW (ref 9–23)
BUN: 6 mg/dL (ref 6–24)
Bilirubin Total: 0.6 mg/dL (ref 0.0–1.2)
CO2: 18 mmol/L — ABNORMAL LOW (ref 20–29)
Calcium: 9.6 mg/dL (ref 8.7–10.2)
Chloride: 90 mmol/L — ABNORMAL LOW (ref 96–106)
Creatinine, Ser: 0.81 mg/dL (ref 0.57–1.00)
Globulin, Total: 2.5 g/dL (ref 1.5–4.5)
Glucose: 457 mg/dL — ABNORMAL HIGH (ref 70–99)
Potassium: 5.4 mmol/L — ABNORMAL HIGH (ref 3.5–5.2)
Sodium: 132 mmol/L — ABNORMAL LOW (ref 134–144)
Total Protein: 7.1 g/dL (ref 6.0–8.5)
eGFR: 89 mL/min/{1.73_m2} (ref >59)

## 2023-12-05 LAB — CELIAC DISEASE AB SCREEN W/RFX
Antigliadin Abs, IgA: 9 U (ref 0–19)
IgA/Immunoglobulin A, Serum: 268 mg/dL (ref 87–352)
Transglutaminase IgA: <2 U/mL (ref 0–3)

## 2023-12-05 LAB — HEMOGLOBIN A1C
Est. average glucose Bld gHb Est-mCnc: 289 mg/dL
Hgb A1c MFr Bld: 11.7 % — ABNORMAL HIGH (ref 4.8–5.6)

## 2023-12-05 LAB — TSH+FREE T4
Free T4: 1.06 ng/dL (ref 0.82–1.77)
TSH: 4.44 u[IU]/mL (ref 0.450–4.500)

## 2023-12-05 NOTE — Telephone Encounter (Signed)
 Left message at this time for patient to return call to office.

## 2023-12-08 ENCOUNTER — Telehealth: Payer: Self-pay

## 2023-12-08 NOTE — Telephone Encounter (Signed)
 Mailed letter asking patient to return call to office- third attempt-Call and tell patient labs show ALT emergency department:  1.  Normal CBC.  White count and hemoglobin are normal.  No anemia.  2.  Her glucose and hemoglobin A1c are very elevated.  Glucose 457.  Hemoglobin A1c 11.7 (goal less than 6.5).  It is extremely important that she establish care with new PCP and endocrinologist as soon as possible to get back on diabetic medication and get her sugars under control.  We cannot schedule any procedures until her sugar has improved.  If she is feeling weak, or poorly, then she should go to ED for treatment of diabetic ketoacidosis and IV fluids.  3.  Sodium is low, potassium is mildly elevated, chloride low.  Please drink 64 ounces of water daily.  Needs repeat BMP lab in 2 weeks.  Avoid potassium supplements or potassium rich foods such as bananas, orange juice, tomatoes.  4.  AST and ALT liver tests are elevated.  Please avoid alcohol and Tylenol.  We will repeat her liver tests at next follow-up office visit.  5.  Celiac labs are pending.  Will let her know once we get those results.  Ellouise Console, PA-C

## 2023-12-24 NOTE — Progress Notes (Unsigned)
There were no vitals taken for this visit.   Subjective:    Patient ID: Julie Abbott, female    DOB: [birth year], 48 y.o.   MRN: [id number]  HPI: Julie Abbott is a 48 y.o. female  No chief complaint on file.   Discussed the use of AI scribe software for clinical note transcription with the patient, who gave verbal consent to proceed.  History of Present Illness            No data to display          Relevant past medical, surgical, family and social history reviewed and updated as indicated. Interim medical history since our last visit reviewed. Allergies and medications reviewed and updated.  Review of Systems  Per HPI unless specifically indicated above     Objective:    There were no vitals taken for this visit.  {Vitals History (Optional):23777} Wt Readings from Last 3 Encounters:  12/02/23 173 lb (78.5 kg)    Physical Exam  Results for orders placed or performed in visit on 12/02/23  Celiac Disease Ab Screen w/Rfx   Collection Time: 12/02/23  8:59 AM  Result Value Ref Range   Antigliadin Abs, IgA 9 0 - 19 units   Transglutaminase IgA <2 0 - 3 U/mL   IgA/Immunoglobulin A, Serum 268 87 - 352 mg/dL  CBC with Differential/Platelet   Collection Time: 12/02/23  8:59 AM  Result Value Ref Range   WBC 6.6 3.4 - 10.8 x10E3/uL   RBC 4.83 3.77 - 5.28 x10E6/uL   Hemoglobin 14.9 11.1 - 15.9 g/dL   Hematocrit 95.1 (H) 88.4 - 46.6 %   MCV 97 79 - 97 fL   MCH 30.8 26.6 - 33.0 pg   MCHC 31.8 31.5 - 35.7 g/dL   RDW 16.6 06.3 - 01.6 %   Platelets 204 150 - 450 x10E3/uL   Neutrophils 53 Not Estab. %   Lymphs 40 Not Estab. %   Monocytes 5 Not Estab. %   Eos 1 Not Estab. %   Basos 1 Not Estab. %   Neutrophils Absolute 3.5 1.4 - 7.0 x10E3/uL   Lymphocytes Absolute 2.7 0.7 - 3.1 x10E3/uL   Monocytes Absolute 0.4 0.1 - 0.9 x10E3/uL   EOS (ABSOLUTE) 0.1 0.0 - 0.4 x10E3/uL   Basophils Absolute 0.1 0.0 - 0.2 x10E3/uL   Immature Granulocytes 0 Not Estab. %   Immature  Grans (Abs) 0.0 0.0 - 0.1 x10E3/uL  TSH + free T4   Collection Time: 12/02/23  8:59 AM  Result Value Ref Range   TSH 4.440 0.450 - 4.500 uIU/mL   Free T4 1.06 0.82 - 1.77 ng/dL  Comprehensive metabolic panel   Collection Time: 12/02/23  8:59 AM  Result Value Ref Range   Glucose 457 (H) 70 - 99 mg/dL   BUN 6 6 - 24 mg/dL   Creatinine, Ser 0.10 0.57 - 1.00 mg/dL   eGFR 89 >93 AT/FTD/3.22   BUN/Creatinine Ratio 7 (L) 9 - 23   Sodium 132 (L) 134 - 144 mmol/L   Potassium 5.4 (H) 3.5 - 5.2 mmol/L   Chloride 90 (L) 96 - 106 mmol/L   CO2 18 (L) 20 - 29 mmol/L   Calcium 9.6 8.7 - 10.2 mg/dL   Total Protein 7.1 6.0 - 8.5 g/dL   Albumin 4.6 3.9 - 4.9 g/dL   Globulin, Total 2.5 1.5 - 4.5 g/dL   Bilirubin Total 0.6 0.0 - 1.2 mg/dL   Alkaline Phosphatase 99 44 - 121  IU/L   AST 93 (H) 0 - 40 IU/L   ALT 91 (H) 0 - 32 IU/L  HgB A1c   Collection Time: 12/02/23  8:59 AM  Result Value Ref Range   Hgb A1c MFr Bld 11.7 (H) 4.8 - 5.6 %   Est. average glucose Bld gHb Est-mCnc 289 mg/dL   {Labs (ZOXWRUEA):54098}    Assessment & Plan:   Problem List Items Addressed This Visit   None    Assessment and Plan             Follow up plan: No follow-ups on file.

## 2023-12-25 ENCOUNTER — Other Ambulatory Visit (HOSPITAL_COMMUNITY)
Admission: RE | Admit: 2023-12-25 | Discharge: 2023-12-25 | Disposition: A | Discharge status: Home / Self Care | Payer: 59 | Source: Ambulatory Visit | Attending: Nurse Practitioner | Admitting: Nurse Practitioner

## 2023-12-25 ENCOUNTER — Encounter: Payer: Self-pay | Admitting: Nurse Practitioner

## 2023-12-25 ENCOUNTER — Other Ambulatory Visit: Payer: Self-pay

## 2023-12-25 ENCOUNTER — Ambulatory Visit: Payer: 59 | Admitting: Nurse Practitioner

## 2023-12-25 VITALS — BP 124/82 | HR 120 | Temp 98.1°F | Resp 16 | Ht 64.0 in

## 2023-12-25 DIAGNOSIS — Z23 Encounter for immunization: Secondary | ICD-10-CM | POA: Diagnosis not present

## 2023-12-25 DIAGNOSIS — F411 Generalized anxiety disorder: Secondary | ICD-10-CM

## 2023-12-25 DIAGNOSIS — E1165 Type 2 diabetes mellitus with hyperglycemia: Secondary | ICD-10-CM

## 2023-12-25 DIAGNOSIS — R300 Dysuria: Secondary | ICD-10-CM

## 2023-12-25 DIAGNOSIS — F909 Attention-deficit hyperactivity disorder, unspecified type: Secondary | ICD-10-CM

## 2023-12-25 DIAGNOSIS — F3170 Bipolar disorder, currently in remission, most recent episode unspecified: Secondary | ICD-10-CM

## 2023-12-25 DIAGNOSIS — Z794 Long term (current) use of insulin: Secondary | ICD-10-CM | POA: Diagnosis not present

## 2023-12-25 DIAGNOSIS — F4310 Post-traumatic stress disorder, unspecified: Secondary | ICD-10-CM | POA: Diagnosis not present

## 2023-12-25 DIAGNOSIS — R0602 Shortness of breath: Secondary | ICD-10-CM

## 2023-12-25 LAB — POCT URINALYSIS DIPSTICK
Bilirubin, UA: NEGATIVE
Blood, UA: NEGATIVE
Glucose, UA: POSITIVE — AB
Ketones, UA: POSITIVE
Leukocytes, UA: NEGATIVE
Nitrite, UA: NEGATIVE
Odor: NORMAL
Protein, UA: NEGATIVE
Spec Grav, UA: 1.025 (ref 1.010–1.025)
Urobilinogen, UA: 0.2 U/dL
pH, UA: 5.5 (ref 5.0–8.0)

## 2023-12-25 MED ORDER — INSULIN PEN NEEDLE 32G X 6 MM MISC: 1.0000 | Freq: Every day | 1 refills | Status: AC

## 2023-12-25 MED ORDER — LANTUS SOLOSTAR 100 UNIT/ML ~~LOC~~ SOPN
10.0000 [IU] | PEN_INJECTOR | Freq: Every day | SUBCUTANEOUS | 1 refills | Status: DC
Start: 2023-12-25 — End: 2024-04-05

## 2023-12-26 ENCOUNTER — Other Ambulatory Visit: Payer: Self-pay | Admitting: Nurse Practitioner

## 2023-12-26 ENCOUNTER — Encounter: Payer: Self-pay | Admitting: Nurse Practitioner

## 2023-12-26 DIAGNOSIS — B379 Candidiasis, unspecified: Secondary | ICD-10-CM

## 2023-12-26 DIAGNOSIS — B9689 Other specified bacterial agents as the cause of diseases classified elsewhere: Secondary | ICD-10-CM

## 2023-12-26 LAB — CERVICOVAGINAL ANCILLARY ONLY
Bacterial Vaginitis (gardnerella): POSITIVE — AB
Candida Glabrata: POSITIVE — AB
Candida Vaginitis: POSITIVE — AB
Chlamydia: NEGATIVE
Comment: NEGATIVE
Comment: NEGATIVE
Comment: NEGATIVE
Comment: NEGATIVE
Comment: NEGATIVE
Comment: NORMAL
Neisseria Gonorrhea: NEGATIVE
Trichomonas: NEGATIVE

## 2023-12-26 MED ORDER — FLUCONAZOLE 150 MG PO TABS: 150.0000 mg | ORAL_TABLET | ORAL | 0 refills | Status: DC | PRN

## 2023-12-26 MED ORDER — METRONIDAZOLE 500 MG PO TABS: 500.0000 mg | ORAL_TABLET | Freq: Two times a day (BID) | ORAL | 0 refills | Status: AC

## 2023-12-28 ENCOUNTER — Encounter: Payer: Self-pay | Admitting: Nurse Practitioner

## 2023-12-29 ENCOUNTER — Other Ambulatory Visit: Payer: Self-pay | Admitting: Nurse Practitioner

## 2023-12-29 DIAGNOSIS — N760 Acute vaginitis: Secondary | ICD-10-CM

## 2023-12-29 MED ORDER — CLINDAMYCIN PHOSPHATE 2 % VA CREA: 1.0000 | TOPICAL_CREAM | Freq: Every day | VAGINAL | 0 refills | Status: DC

## 2023-12-31 ENCOUNTER — Ambulatory Visit: Payer: Medicare HMO | Admitting: Physician Assistant

## 2024-01-01 ENCOUNTER — Encounter: Payer: Self-pay | Admitting: Behavioral Health

## 2024-01-01 ENCOUNTER — Ambulatory Visit: Payer: 59 | Admitting: Behavioral Health

## 2024-01-01 DIAGNOSIS — F411 Generalized anxiety disorder: Secondary | ICD-10-CM

## 2024-01-01 DIAGNOSIS — G4700 Insomnia, unspecified: Secondary | ICD-10-CM | POA: Diagnosis not present

## 2024-01-01 DIAGNOSIS — F319 Bipolar disorder, unspecified: Secondary | ICD-10-CM | POA: Diagnosis not present

## 2024-01-01 MED ORDER — BUPROPION HCL ER (XL) 300 MG PO TB24: ORAL_TABLET | ORAL | 0 refills | Status: DC

## 2024-01-01 MED ORDER — FANAPT 1 MG PO TABS: ORAL_TABLET | ORAL | 2 refills | Status: DC

## 2024-01-01 MED ORDER — HYDROXYZINE HCL 25 MG PO TABS: ORAL_TABLET | ORAL | 1 refills | Status: DC

## 2024-01-01 NOTE — Progress Notes (Signed)
[facility]  Patient ID: Julie Abbott,  MRN: [id number]  PCP: Berniece Salines, FNP  Date of Evaluation: 01/01/2024 Time spent:40 minutes  Chief Complaint:  Chief Complaint   Anxiety; Depression; Follow-up; Patient Education; Medication Refill     HISTORY/CURRENT STATUS: HPI  Julie Abbott presents to the office today for follow-up of anxiety, Bipolar disorder, ADHD,  "Says that she feels stable this visit. Says that she has not experienced full blown mania in at least a couple years.Says that she stays depressed most of the time. For now she does not want to adjust medication and agrees to 3 month follow up. Continue to work for [employer] which is high stress job. Acknowledges that high work stress can trigger deeper depression nor mania. She feels like Fanapt is helping but had questions about taking it along with Ambien at bedtime.    She will be at her job for a year in April. 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 She reports working yesterday over the holiday to try to not be as behind. She reports working long hours and weekends.     Ambien last filled 11/23/23 x 2.  Pregabalin last filled 11/15/23 x 2 Adderall last filled 10/23/23   Past Psychiatric Medication Trials: Ritalin- Prescribed when she was 48 yo. Felt like a zombie.  Adderall- was taking before pain medication. Would have "crash" in the afternoon. Adderall XR Vyvanse- Ineffective Latuda- Was taking in [place] at 80 mg BID. Reports that Latuda seemed to decrease frequency of manic s/s. Depression was less. Denies side effects Vraylar- Ineffective Abilify Risperdal Geodon Seroquel- Excessive somnolence Lamictal- Tolerated ok. Had some arthralgias. Depakote Trileptal Wellbutrin- Has taken long-term. Was increased to 300 mg po qd in the last 1-2 years. Denies side effects. Helps with decreasing smoking. Citalopram Cymbalta Buspar- Started by PCP in [place] after major stressor before moving to [place]. Thinks it is  somewhat helpful for anxiety and rumination. Denies side effective. Ambien- Most effective for sleep.  Has taken for about 10-15 years Ambien CR Lunesta- ineffective. Melatonin Hydroxyzine Lyrica- Prescribed for pain, neuropathy, and mood stabilization.  Gabapentin-Adverse reaction Belsomra Trazodone- Adverse/paradoxical response  Rexulti-Fatigue   Individual Medical History/ Review of Systems: Changes? :No   Allergies: Bee venom, Gabapentin, Lisinopril, Lortab [hydrocodone-acetaminophen], and Ozempic (0.25 or 0.5 mg-dose) [semaglutide(0.25 or 0.5mg -dos)]  Current Medications:  Current Outpatient Medications:    amphetamine-dextroamphetamine (ADDERALL XR) 20 MG 24 hr capsule, Take 1 capsule (20 mg total) by mouth daily., Disp: 30 capsule, Rfl: 0   amphetamine-dextroamphetamine (ADDERALL XR) 20 MG 24 hr capsule, Take 1 capsule (20 mg total) by mouth daily., Disp: 30 capsule, Rfl: 0   amphetamine-dextroamphetamine (ADDERALL XR) 20 MG 24 hr capsule, Take 1 capsule (20 mg total) by mouth daily., Disp: 30 capsule, Rfl: 0   buPROPion (WELLBUTRIN XL) 300 MG 24 hr tablet, TAKE 1 TABLET BY MOUTH ONCE DAILY IN THE MORNING, Disp: 90 tablet, Rfl: 0   clindamycin (CLEOCIN) 2 % vaginal cream, Place 1 Applicatorful vaginally at bedtime., Disp: 40 g, Rfl: 0   fluconazole (DIFLUCAN) 150 MG tablet, Take 1 tablet (150 mg total) by mouth every 3 (three) days as needed (for vaginal itching/yeast infection sx)., Disp: 2 tablet, Rfl: 0   hydrOXYzine (ATARAX) 25 MG tablet, TAKE 1 TABLET BY MOUTH EVERYDAY AT BEDTIME, Disp: 90 tablet, Rfl: 0   Iloperidone (FANAPT) 1 MG TABS, Take 1 tablet at bedtime for 4 nights, then may increase to 2 tablets at bedtime if needed/tolerated, Disp:  28 tablet, Rfl: 0   Iloperidone (FANAPT) 1 MG TABS, Take 1-2 tablets at bedtime, Disp: 60 tablet, Rfl: 0   Insulin Pen Needle 32G X 6 MM MISC, 1 each by Does not apply route daily., Disp: 100 each, Rfl: 1   LANTUS SOLOSTAR 100 UNIT/ML  Solostar Pen, Inject 10 Units into the skin daily., Disp: 15 mL, Rfl: 1   metroNIDAZOLE (FLAGYL) 500 MG tablet, Take 1 tablet (500 mg total) by mouth 2 (two) times daily for 7 days., Disp: 14 tablet, Rfl: 0   pregabalin (LYRICA) 200 MG capsule, TAKE 1 CAPSULE (200 MG TOTAL) BY MOUTH 3 (THREE) TIMES DAILY., Disp: 90 capsule, Rfl: 5   [START ON 01/18/2024] zolpidem (AMBIEN) 10 MG tablet, TAKE 1/2 TO 1 TABLET BY MOUTH AT BEDTIME, Disp: 30 tablet, Rfl: 2 Medication Side Effects: none  Family Medical/ Social History: Changes? No  MENTAL HEALTH EXAM:  There were no vitals taken for this visit.There is no height or weight on file to calculate BMI.  General Appearance: Casual, Neat, and Well Groomed  Eye Contact:  Good  Speech:  Clear and Coherent  Volume:  Normal  Mood:  NA  Affect:  Appropriate  Thought Process:  Coherent  Orientation:  Full (Time, Place, and Person)  Thought Content: Logical   Suicidal Thoughts:  No  Homicidal Thoughts:  No  Memory:  WNL  Judgement:  Good  Insight:  Good  Psychomotor Activity:  Normal  Concentration:  Concentration: Good  Recall:  Good  Fund of Knowledge: Good  Language: Good  Assets:  Desire for Improvement  ADL's:  Intact  Cognition: WNL  Prognosis:  Good    DIAGNOSES:    ICD-10-CM   1. GAD (generalized anxiety disorder)  F41.1     2. Insomnia, unspecified type  G47.00     3. Bipolar affective disorder, remission status unspecified (HCC)  F31.9       Receiving Psychotherapy: No    RECOMMENDATIONS:   AIMS Score 1   Greater than 50% of  40 min face to face time with patient was spent on counseling and coordination of care. We reviewed her previous plan of care with Corie Chiquito, NP. Reviewed very lengthy past list of medication trials and current regimen.   Discussed potential benefits, risks, and side effects of Fanapt for mood stabilization, anxiety, and insomnia. Reviewed potential adverse effects with atypical antipsychotics.  Discussed that Fanapt has less risk of metabolic side effects compared to many other atypical antipsychotics. Continue  to  Fanapt 2 mg at bedtime  daily Reviewed provider's comments regarding lab results and encouraged pt to follow-up with medical providers as soon as possible regarding glucose levels. Will continue Wellbutrin XL 300 mg daily for depression.  Continue Hydroxyzine 25 mg at bedtime for insomnia and anxiety.  Continue Ambien 10 mg 1/2-1 tablet at bedtime for insomnia. Continue Adderall XR 20 mg daily for ADHD.  Patient advised to contact office with any questions, adverse effects, or acute worsening in signs and symptoms. To follow up in 3 months per pt Provided emergency contact information Reviewed PDMP     Joan Flores, NP

## 2024-01-02 ENCOUNTER — Telehealth: Payer: Self-pay | Admitting: Nurse Practitioner

## 2024-01-02 NOTE — Telephone Encounter (Signed)
Pt is calling to request referrals due to her insurance for Urology (apt is Monday), GI.  Please advise when completed. CB- [phone]

## 2024-01-02 NOTE — Telephone Encounter (Signed)
Left detailed vm to clarify is she needing urology or GI? And what is the reason?

## 2024-01-05 ENCOUNTER — Encounter: Payer: Self-pay | Admitting: Urology

## 2024-02-11 ENCOUNTER — Ambulatory Visit (INDEPENDENT_AMBULATORY_CARE_PROVIDER_SITE_OTHER): Payer: Medicare HMO | Admitting: Internal Medicine

## 2024-02-11 ENCOUNTER — Telehealth: Payer: Self-pay

## 2024-02-11 ENCOUNTER — Encounter: Payer: Self-pay | Admitting: Internal Medicine

## 2024-02-11 ENCOUNTER — Other Ambulatory Visit: Payer: Self-pay

## 2024-02-11 VITALS — BP 128/82 | HR 100 | Temp 98.0°F | Resp 16 | Ht 64.0 in | Wt 171.5 lb

## 2024-02-11 DIAGNOSIS — N3010 Interstitial cystitis (chronic) without hematuria: Secondary | ICD-10-CM | POA: Diagnosis not present

## 2024-02-11 DIAGNOSIS — E1165 Type 2 diabetes mellitus with hyperglycemia: Secondary | ICD-10-CM | POA: Diagnosis not present

## 2024-02-11 DIAGNOSIS — F909 Attention-deficit hyperactivity disorder, unspecified type: Secondary | ICD-10-CM

## 2024-02-11 DIAGNOSIS — Z794 Long term (current) use of insulin: Secondary | ICD-10-CM | POA: Diagnosis not present

## 2024-02-11 DIAGNOSIS — R300 Dysuria: Secondary | ICD-10-CM | POA: Diagnosis not present

## 2024-02-11 DIAGNOSIS — N3000 Acute cystitis without hematuria: Secondary | ICD-10-CM

## 2024-02-11 DIAGNOSIS — R809 Proteinuria, unspecified: Secondary | ICD-10-CM

## 2024-02-11 LAB — POCT URINALYSIS DIPSTICK
Bilirubin, UA: NEGATIVE
Glucose, UA: NEGATIVE
Nitrite, UA: POSITIVE
Protein, UA: POSITIVE — AB
Spec Grav, UA: 1.02 (ref 1.010–1.025)
Urobilinogen, UA: 0.2 U/dL
pH, UA: 5 (ref 5.0–8.0)

## 2024-02-11 MED ORDER — SULFAMETHOXAZOLE-TRIMETHOPRIM 800-160 MG PO TABS: 1.0000 | ORAL_TABLET | Freq: Two times a day (BID) | ORAL | 0 refills | Status: AC

## 2024-02-11 NOTE — Assessment & Plan Note (Signed)
 Uncontrolled, not currently on any medications.  Obtain microalbumin today.  Start Lantus 10 units daily, sample of Dexcom 7 given to patient so she can check fasting and postprandial sugars and bring them to our next visit.  Discussed diet and lifestyle changes extensively, decreasing sugar and carbs and increasing protein and fiber.

## 2024-02-11 NOTE — Telephone Encounter (Signed)
 Copied from CRM [id number]. Topic: Clinical - Medical Advice >> Feb 11, 2024  9:22 AM Tiffany B wrote: Reason for CRM: Faxing over past last exam, please notate when received.

## 2024-02-11 NOTE — Progress Notes (Signed)
 Established Patient Office Visit  Subjective   Patient ID: Julie Abbott, female    DOB: [birth year]  Age: 48 y.o. MRN: [id number]  Chief Complaint  Patient presents with   Medical Management of Chronic Issues    HPI  Patient is here for follow up on chronic medical conditions. This is my first time meeting her as she was seeing a different provider here.   Diabetes, Type 2: -Last A1c 11.7 12/24 -Diagnosed with type 2 diabetes when she was 47 years old after being diagnosed with PCOS as well  -Medications: nothing currently, had been prescribed Lantus 10 units daily but had an insurance problem and hasn't gotten it filled yet -Had been on Metformin for 15 years but had GI side effects and wasn't working well, had also been on sulfonylureas which didn't work, has been on insulin since 48 years old. Did well with Victoza in terms of sugars but was lightheaded, Ozempic caused pancreatitis last year. Jardiance cause vaginal infections   -Patient is compliant with the above medications and reports no side effects.  -Checking BG at home: no, does have dexcom 7 at home but didn't have the app, now has a new phone and can get the app -Diet: tries to stay well hydrated, drinking juice, trying to eat protein -Eye exam: UTD -Foot exam: UTD -Microalbumin: due -Statin: no -PNA vaccine: UTD -Denies symptoms of hypoglycemia, polyuria, polydipsia, foot ulcers/trauma.   Bipolar/PTSD/ADHD: -Following with Psychiatry  -Currently on Iloperidone 1 mg, Wellbutrin 300 mg XL, Adderall 20 mg XL once daily hydroxyzine as needed and 10 mg, Lyrica 200 mg TID  Also having some dysuria symptoms, has been diagnosed with IC and now having urinary incontinence as a result.  Needing referral to urology.    Review of Systems  Genitourinary:  Positive for dysuria, frequency and urgency. Negative for flank pain and hematuria.      Objective:     BP 128/82 (Cuff Size: Large)   Pulse 100   Temp 98 F (36.7  C) (Oral)   Resp 16   Ht 5\' 4"  (1.626 m)   Wt 171 lb 8 oz (77.8 kg)   SpO2 98%   BMI 29.44 kg/m  BP Readings from Last 3 Encounters:  02/11/24 128/82  12/25/23 124/82  12/02/23 131/78   Wt Readings from Last 3 Encounters:  02/11/24 171 lb 8 oz (77.8 kg)  12/02/23 173 lb (78.5 kg)      Physical Exam Constitutional:      Appearance: Normal appearance.  HENT:     Head: Normocephalic and atraumatic.  Eyes:     Conjunctiva/sclera: Conjunctivae normal.  Cardiovascular:     Rate and Rhythm: Normal rate and regular rhythm.  Pulmonary:     Effort: Pulmonary effort is normal.     Breath sounds: Normal breath sounds.  Skin:    General: Skin is warm and dry.  Neurological:     General: No focal deficit present.     Mental Status: She is alert. Mental status is at baseline.  Psychiatric:        Mood and Affect: Mood normal.        Behavior: Behavior normal.      Results for orders placed or performed in visit on 02/11/24  POCT Urinalysis Dipstick  Result Value Ref Range   Color, UA yellow    Clarity, UA cloudy    Glucose, UA Negative Negative   Bilirubin, UA negative    Ketones, UA small  Spec Grav, UA 1.020 1.010 - 1.025   Blood, UA small    pH, UA 5.0 5.0 - 8.0   Protein, UA Positive (A) Negative   Urobilinogen, UA 0.2 0.2 or 1.0 E.U./dL   Nitrite, UA positive    Leukocytes, UA Small (1+) (A) Negative   Appearance cloudy    Odor none     Last CBC Lab Results  Component Value Date   WBC 6.6 12/02/2023   HGB 14.9 12/02/2023   HCT 46.9 (H) 12/02/2023   MCV 97 12/02/2023   MCH 30.8 12/02/2023   RDW 12.1 12/02/2023   PLT 204 12/02/2023   Last metabolic panel Lab Results  Component Value Date   GLUCOSE 457 (H) 12/02/2023   NA 132 (L) 12/02/2023   K 5.4 (H) 12/02/2023   CL 90 (L) 12/02/2023   CO2 18 (L) 12/02/2023   BUN 6 12/02/2023   CREATININE 0.81 12/02/2023   EGFR 89 12/02/2023   CALCIUM 9.6 12/02/2023   PROT 7.1 12/02/2023   ALBUMIN 4.6  12/02/2023   LABGLOB 2.5 12/02/2023   BILITOT 0.6 12/02/2023   ALKPHOS 99 12/02/2023   AST 93 (H) 12/02/2023   ALT 91 (H) 12/02/2023   Last lipids No results found for: "CHOL", "HDL", "LDLCALC", "LDLDIRECT", "TRIG", "CHOLHDL" Last hemoglobin A1c Lab Results  Component Value Date   HGBA1C 11.7 (H) 12/02/2023   Last thyroid functions Lab Results  Component Value Date   TSH 4.440 12/02/2023   Last vitamin D No results found for: "25OHVITD2", "25OHVITD3", "VD25OH" Last vitamin B12 and Folate No results found for: "VITAMINB12", "FOLATE"    The 10-year ASCVD risk score (Arnett DK, et al., 2019) is: 4.3%    Assessment & Plan:  Type 2 diabetes mellitus with hyperglycemia, with long-term current use of insulin (HCC) Assessment & Plan: Uncontrolled, not currently on any medications.  Obtain microalbumin today.  Start Lantus 10 units daily, sample of Dexcom 7 given to patient so she can check fasting and postprandial sugars and bring them to our next visit.  Discussed diet and lifestyle changes extensively, decreasing sugar and carbs and increasing protein and fiber.  Orders: -     Microalbumin / creatinine urine ratio  IC (interstitial cystitis) Assessment & Plan: Urine sample today consistent with UTI, will treat with antibiotics and place referral to urology.  Orders: -     Ambulatory referral to Urology  Dysuria -     POCT urinalysis dipstick  Acute cystitis without hematuria -     Sulfamethoxazole-Trimethoprim; Take 1 tablet by mouth 2 (two) times daily for 3 days.  Dispense: 6 tablet; Refill: 0 -     Urine Culture  Attention deficit hyperactivity disorder (ADHD), unspecified ADHD type Assessment & Plan: Following with psychiatry, medications reviewed.   UA showing nitrates and trace leukocytes, will send for urine culture and treat with Bactrim x 3 days.  Return in about 4 weeks (around 03/10/2024).    Margarita Mail, DO

## 2024-02-11 NOTE — Assessment & Plan Note (Addendum)
 Urine sample today consistent with UTI, will treat with antibiotics and place referral to urology.

## 2024-02-11 NOTE — Assessment & Plan Note (Signed)
 Following with psychiatry, medications reviewed.

## 2024-02-12 LAB — MICROALBUMIN / CREATININE URINE RATIO
Creatinine, Urine: 35 mg/dL (ref 20–275)
Microalb Creat Ratio: 251 mg/g{creat} — ABNORMAL HIGH (ref <30)
Microalb, Ur: 8.8 mg/dL

## 2024-02-12 MED ORDER — LOSARTAN POTASSIUM 25 MG PO TABS: 25.0000 mg | ORAL_TABLET | Freq: Every day | ORAL | 0 refills | Status: DC

## 2024-02-12 NOTE — Addendum Note (Signed)
 Addended by: Margarita Mail on: 02/12/2024 08:07 AM   Modules accepted: Orders

## 2024-02-13 ENCOUNTER — Encounter: Payer: Self-pay | Admitting: Internal Medicine

## 2024-02-13 LAB — URINE CULTURE
MICRO NUMBER:: 16192691
SPECIMEN QUALITY:: ADEQUATE

## 2024-03-10 ENCOUNTER — Ambulatory Visit: Admitting: Internal Medicine

## 2024-03-22 ENCOUNTER — Ambulatory Visit: Admitting: Internal Medicine

## 2024-03-26 ENCOUNTER — Inpatient Hospital Stay: Admit: 2024-03-26 | Admitting: Family Medicine

## 2024-03-26 ENCOUNTER — Inpatient Hospital Stay: Admitting: Internal Medicine

## 2024-03-26 ENCOUNTER — Encounter (HOSPITAL_COMMUNITY): Payer: Self-pay

## 2024-03-26 NOTE — Progress Notes (Deleted)
 {  Select_TRH_Note:[id number]}

## 2024-03-26 NOTE — Progress Notes (Deleted)
 Pt currently at [facility]- Admitted  Re- Pancreatitis, NSTEMI  66 YOF with baseline history of type 2 diabetes, bipolar disorder, COPD. Came into [facility]. Prior hx/o pancreatitis assd with ozempic. Current lipase 4K, WBC 13. Current trop 0.09. + family hx/o early coronary disease. EKG NSR w/ nonspecific EKG changes. HEART Score around 5. S/p aspirin. Case was discussed w/ Dr. Beau Bound with [facility] cardiology per report. Will formally consult on arrival to [facility]. Chest pain improved at present.Asking for NTG to be placed and to start heparin gtt if trop significantly uptrends or chest pain worsens.  Current vitals: T 97.8, HR 106, RR- 15, BP 162/100, 97% on RA  Accepted to cardiac tele bed.

## 2024-03-29 ENCOUNTER — Ambulatory Visit (INDEPENDENT_AMBULATORY_CARE_PROVIDER_SITE_OTHER): Admitting: Internal Medicine

## 2024-03-29 ENCOUNTER — Encounter: Payer: Self-pay | Admitting: Internal Medicine

## 2024-03-29 ENCOUNTER — Other Ambulatory Visit: Payer: Self-pay

## 2024-03-29 VITALS — BP 132/84 | HR 108 | Temp 98.0°F | Resp 16 | Ht 64.0 in | Wt 164.5 lb

## 2024-03-29 DIAGNOSIS — E1165 Type 2 diabetes mellitus with hyperglycemia: Secondary | ICD-10-CM

## 2024-03-29 DIAGNOSIS — Z794 Long term (current) use of insulin: Secondary | ICD-10-CM | POA: Diagnosis not present

## 2024-03-29 DIAGNOSIS — K859 Acute pancreatitis without necrosis or infection, unspecified: Secondary | ICD-10-CM

## 2024-03-29 MED ORDER — TRAMADOL HCL 50 MG PO TABS: 50.0000 mg | ORAL_TABLET | Freq: Three times a day (TID) | ORAL | 0 refills | Status: AC | PRN

## 2024-03-29 NOTE — Progress Notes (Signed)
 Established Patient Office Visit  Subjective   Patient ID: Julie Abbott, female    DOB: 1976-06-13  Age: 48 y.o. MRN: [id number]  Chief Complaint  Patient presents with   Hospitalization Follow-up    Seen at [facility] and then [facility]    HPI  Patient is here for ER follow up.   Discussed the use of AI scribe software for clinical note transcription with the patient, who gave verbal consent to proceed.  History of Present Illness The patient, with a history of diabetes and back pain due to herniated discs, presents with persistent abdominal and back pain following a recent hospitalization for pancreatitis. The patient reports that the pain feels like being "stabbed" and it radiates through to the back. The pain is intermittent and severe enough to wake the patient from sleep. The patient also reports difficulty breathing, which was initially suspected to be related to a recent diagnosis of pneumonia. However, the patient questions this diagnosis as she continues to experience breathing difficulties despite treatment. The patient also reports high blood sugar levels, which she has been managing with Lantus  and Humalog. The patient was recently prescribed baby aspirin due to elevated troponin levels during her hospital stay. The patient has been on a liquid diet since her hospitalization and reports that consuming too much liquid at once can make her feel nauseous.  Discharge Date: 03/27/24 Hospital/facility: [facility]  Diagnosis: pancreatitis  Procedures/tests: CT A/P with mildly dilated pancreatitis duct with stranding but no lesions or cysts. Chest x-ray negative for PNA Consultants: None New medications: Insulin   Status: fluctuating  Diabetes, Type 2: -Last A1c 11.7 12/24 -Diagnosed with type 2 diabetes when she was 48 years old after being diagnosed with PCOS as well  -Medications: now on Lantus  30 units at bedtime, Humalog 10 units before meals -Had been on  Metformin for 15 years but had GI side effects and wasn't working well, had also been on sulfonylureas which didn't work, has been on insulin  since 48 years old. Did well with Victoza in terms of sugars but was lightheaded, Ozempic caused pancreatitis last year. Jardiance cause vaginal infections   -Patient is compliant with the above medications and reports no side effects.  -Checking BG at home: no, does have dexcom 7 at home but didn't have the app, now has a new phone and can get the app -Diet: tries to stay well hydrated, drinking juice, trying to eat protein -Eye exam: UTD -Foot exam: UTD -Microalbumin: UTD -Statin: no -PNA vaccine: UTD -Denies symptoms of hypoglycemia, polyuria, polydipsia, foot ulcers/trauma.   Bipolar/PTSD/ADHD: -Following with Psychiatry  -Currently on Iloperidone  1 mg, Wellbutrin  300 mg XL, Adderall  20 mg XL once daily hydroxyzine  as needed and 10 mg, Lyrica  200 mg TID   Review of Systems  Constitutional:  Negative for chills and fever.  Respiratory:  Positive for shortness of breath. Negative for cough and wheezing.   Gastrointestinal:  Positive for abdominal pain and nausea. Negative for constipation, diarrhea and vomiting.      Objective:     BP 132/84 (Cuff Size: Large)   Pulse (!) 108   Temp 98 F (36.7 C) (Oral)   Resp 16   Ht 5\' 4"  (1.626 m)   Wt 164 lb 8 oz (74.6 kg)   SpO2 99%   BMI 28.24 kg/m  BP Readings from Last 3 Encounters:  03/29/24 132/84  02/11/24 128/82  12/25/23 124/82   Wt Readings from Last 3 Encounters:  03/29/24 164 lb  8 oz (74.6 kg)  02/11/24 171 lb 8 oz (77.8 kg)  12/02/23 173 lb (78.5 kg)      Physical Exam Constitutional:      Appearance: Normal appearance.  HENT:     Head: Normocephalic and atraumatic.  Eyes:     Conjunctiva/sclera: Conjunctivae normal.  Cardiovascular:     Rate and Rhythm: Normal rate and regular rhythm.  Pulmonary:     Effort: Pulmonary effort is normal.     Breath sounds: Normal  breath sounds.  Abdominal:     General: Bowel sounds are normal. There is no distension.     Palpations: Abdomen is soft.     Tenderness: There is abdominal tenderness. There is no guarding or rebound.     Comments: Epigastric ptp  Skin:    General: Skin is warm and dry.  Neurological:     General: No focal deficit present.     Mental Status: She is alert. Mental status is at baseline.  Psychiatric:        Mood and Affect: Mood normal.        Behavior: Behavior normal.      No results found for any visits on 03/29/24.   Last CBC Lab Results  Component Value Date   WBC 6.6 12/02/2023   HGB 14.9 12/02/2023   HCT 46.9 (H) 12/02/2023   MCV 97 12/02/2023   MCH 30.8 12/02/2023   RDW 12.1 12/02/2023   PLT 204 12/02/2023   Last metabolic panel Lab Results  Component Value Date   GLUCOSE 457 (H) 12/02/2023   NA 132 (L) 12/02/2023   K 5.4 (H) 12/02/2023   CL 90 (L) 12/02/2023   CO2 18 (L) 12/02/2023   BUN 6 12/02/2023   CREATININE 0.81 12/02/2023   EGFR 89 12/02/2023   CALCIUM 9.6 12/02/2023   PROT 7.1 12/02/2023   ALBUMIN 4.6 12/02/2023   LABGLOB 2.5 12/02/2023   BILITOT 0.6 12/02/2023   ALKPHOS 99 12/02/2023   AST 93 (H) 12/02/2023   ALT 91 (H) 12/02/2023   Last lipids No results found for: "CHOL", "HDL", "LDLCALC", "LDLDIRECT", "TRIG", "CHOLHDL" Last hemoglobin A1c Lab Results  Component Value Date   HGBA1C 11.7 (H) 12/02/2023   Last thyroid functions Lab Results  Component Value Date   TSH 4.440 12/02/2023   Last vitamin D No results found for: "25OHVITD2", "25OHVITD3", "VD25OH" Last vitamin B12 and Folate No results found for: "VITAMINB12", "FOLATE"    The ASCVD Risk score (Arnett DK, et al., 2019) failed to calculate for the following reasons:   Risk score cannot be calculated because patient has a medical history suggesting prior/existing ASCVD    Assessment & Plan:   Assessment & Plan Acute Pancreatitis Confirmed with elevated lipase levels  and imaging. Pain management complicated by past opioid use. Advised to avoid alcohol and exacerbating medications, rest, and maintain hydration. Dietary progression from clear liquids to bland, low-fat foods based on tolerance. - Order lipase, kidney, and electrolyte labs to monitor progress. - Prescribe tramadol 50 mg every 8 hours as needed for severe pain, limited to 5 days. - Advise rest and hydration. - Instruct to follow a clear liquid diet, advancing slowly to bland, low-fat foods. - Avoid alcohol and medications that may cause pancreatitis. - Continue baby aspirin due to elevated troponin levels. - Work note given for the rest week until she can be re-evaluated next week.   Diabetes Mellitus Lantus  increased to 30 units at bedtime. Humalog withheld due to reduced caloric  intake. Advised to avoid sugar and caffeine, and use sugar-free ginger ale. Dexcom reinsertion possible as Lovenox should be cleared. - Continue Lantus  30 units at bedtime. - Administer Humalog 10 units before meals when caloric intake resumes. - Avoid sugar and caffeine. - Use sugar-free ginger ale. - Reinsert Dexcom for glucose monitoring.  Pneumonia Recent pneumonia treated. Current symptoms include intermittent pain and dyspnea, but chest x-ray shows clear lungs. No active infection. - Monitor respiratory symptoms to ensure lungs remain clear.  - Basic Metabolic Panel (BMET) - Lipase - traMADol (ULTRAM) 50 MG tablet; Take 1 tablet (50 mg total) by mouth every 8 (eight) hours as needed for up to 5 days for severe pain (pain score 7-10).  Dispense: 15 tablet; Refill: 0    Return in about 4 weeks (around 04/26/2024) for 40 minutes please .    Rockney Cid, DO

## 2024-03-30 ENCOUNTER — Encounter: Payer: Self-pay | Admitting: Internal Medicine

## 2024-03-30 ENCOUNTER — Ambulatory Visit (INDEPENDENT_AMBULATORY_CARE_PROVIDER_SITE_OTHER): Payer: 59 | Admitting: Behavioral Health

## 2024-03-30 DIAGNOSIS — Z0389 Encounter for observation for other suspected diseases and conditions ruled out: Secondary | ICD-10-CM

## 2024-03-30 LAB — BASIC METABOLIC PANEL WITH GFR
BUN: 8 mg/dL (ref 7–25)
CO2: 27 mmol/L (ref 20–32)
Calcium: 10.3 mg/dL — ABNORMAL HIGH (ref 8.6–10.2)
Chloride: 97 mmol/L — ABNORMAL LOW (ref 98–110)
Creat: 0.69 mg/dL (ref 0.50–0.99)
Glucose, Bld: 422 mg/dL — ABNORMAL HIGH (ref 65–99)
Potassium: 4.8 mmol/L (ref 3.5–5.3)
Sodium: 133 mmol/L — ABNORMAL LOW (ref 135–146)
eGFR: 107 mL/min/{1.73_m2} (ref >=60)

## 2024-03-30 LAB — LIPASE: Lipase: 3201 U/L — ABNORMAL HIGH (ref 7–60)

## 2024-03-30 NOTE — Progress Notes (Signed)
 Pt did not show for scheduled visit and did not provide 24 hour notice as required. Additional fees to be assessed.

## 2024-03-31 ENCOUNTER — Ambulatory Visit: Payer: Self-pay | Admitting: Nurse Practitioner

## 2024-03-31 ENCOUNTER — Telehealth: Payer: Self-pay | Admitting: Internal Medicine

## 2024-03-31 NOTE — Telephone Encounter (Signed)
 Pt is asking that we forgive her two missed appointments for 03/22/24 and 03/26/24. She was admitted into the hospital. I have placed a letter in your white folder per pt request to show proof.

## 2024-03-31 NOTE — Telephone Encounter (Signed)
**Note De-identified  [place] Obfuscation** Please advise 

## 2024-04-01 NOTE — Telephone Encounter (Signed)
It has been taken care of.

## 2024-04-01 NOTE — Telephone Encounter (Signed)
 Please check on this.

## 2024-04-05 ENCOUNTER — Encounter: Payer: Self-pay | Admitting: Internal Medicine

## 2024-04-05 ENCOUNTER — Other Ambulatory Visit: Payer: Self-pay

## 2024-04-05 ENCOUNTER — Ambulatory Visit (INDEPENDENT_AMBULATORY_CARE_PROVIDER_SITE_OTHER): Admitting: Internal Medicine

## 2024-04-05 VITALS — BP 120/74 | HR 100 | Temp 98.0°F | Resp 16 | Ht 64.0 in | Wt 168.9 lb

## 2024-04-05 DIAGNOSIS — R0602 Shortness of breath: Secondary | ICD-10-CM | POA: Diagnosis not present

## 2024-04-05 DIAGNOSIS — Z794 Long term (current) use of insulin: Secondary | ICD-10-CM | POA: Diagnosis not present

## 2024-04-05 DIAGNOSIS — K859 Acute pancreatitis without necrosis or infection, unspecified: Secondary | ICD-10-CM

## 2024-04-05 DIAGNOSIS — Z0289 Encounter for other administrative examinations: Secondary | ICD-10-CM | POA: Diagnosis not present

## 2024-04-05 DIAGNOSIS — E1165 Type 2 diabetes mellitus with hyperglycemia: Secondary | ICD-10-CM | POA: Diagnosis not present

## 2024-04-05 MED ORDER — ALBUTEROL SULFATE HFA 108 (90 BASE) MCG/ACT IN AERS: 2.0000 | INHALATION_SPRAY | Freq: Four times a day (QID) | RESPIRATORY_TRACT | 2 refills | Status: AC | PRN

## 2024-04-05 NOTE — Progress Notes (Signed)
 Established Patient Office Visit  Subjective   Patient ID: Julie Abbott, female    DOB: 16-Apr-1976  Age: 48 y.o. MRN: [id number]  Chief Complaint  Patient presents with   Follow-up    FMLA paperwork    HPI  Patient is here for follow up on pancreatitis and FMLA papers.  Discussed the use of AI scribe software for clinical note transcription with the patient, who gave verbal consent to proceed.  History of Present Illness Julie Abbott is a 48 year old female with diabetes and recent pancreatitis who presents with uncontrolled blood sugars and frequent urination.  She experiences frequent urination, particularly nocturia, every 30 to 45 minutes, with significant discomfort if she does not urinate. This has persisted for the past week. She also has increased thirst. Her blood sugar levels are significantly elevated, with a reading of 440 mg/dL this morning and 422 mg/dL during a recent lab check. She is on insulin  therapy, taking 10 units of Humalog three times a day with meals and 30 units of Lantus  at bedtime, but her blood sugars remain high.  She has a history of pancreatitis, with a lipase level over 4000 during a recent hospital admission, now decreased to 3200 at a follow-up check. She experiences abdominal discomfort, particularly when her bladder is full.  She has difficulty breathing and fatigue, especially when walking short distances. She previously used an albuterol inhaler but stopped all medications during a hospital stay for pancreatitis. She plans to resume its use as she still has some albuterol left.  Her current medications include losartan  25 mg for blood pressure, hydroxyzine  for anxiety and itching, clindamycin  vaginal cream for outer yeast infections, and Ambien  and Lyrica . She has not been taking Wellbutrin  or Fanapt  recently, although she is prescribed Fanapt . She is also prescribed Adderall  but has not taken it in over a month.  She mentions a recent weight  loss, now down to a size 14, which she attributes to her uncontrolled blood sugars. She has been trying to manage her condition by drinking more water and eating less. She works in a job that requires significant physical activity, including visiting multiple families per month, but has been on leave since April 21 due to her medical condition.   Diabetes, Type 2: -Last A1c 12.1 4/25 -Diagnosed with type 2 diabetes when she was 48 years old after being diagnosed with PCOS as well  -Medications: now on Lantus  30 units at bedtime, Humalog 10 units before meals but not eating regularly  -Had been on Metformin for 15 years but had GI side effects and wasn't working well, had also been on sulfonylureas which didn't work, has been on insulin  since 48 years old. Did well with Victoza in terms of sugars but was lightheaded, Ozempic caused pancreatitis last year. Jardiance cause vaginal infections   -Patient is compliant with the above medications and reports no side effects.  -Checking BG at home: has dexcom 7 - fasting sugar >400 -Diet: tries to stay well hydrated, drinking juice, trying to eat protein -Eye exam: UTD -Foot exam: UTD -Microalbumin: UTD -Statin: no -PNA vaccine: UTD -Denies symptoms of hypoglycemia, polyuria, polydipsia, foot ulcers/trauma.   Bipolar/PTSD/ADHD: -Following with Psychiatry  -Currently on Iloperidone  1 mg, Wellbutrin  300 mg XL, Adderall  20 mg XL once daily hydroxyzine  as needed and 10 mg, Lyrica  200 mg TID   Review of Systems  Constitutional:  Negative for chills and fever.  Respiratory:  Positive for shortness of breath. Negative for cough and  wheezing.   Gastrointestinal:  Positive for abdominal pain. Negative for constipation, diarrhea, nausea and vomiting.      Objective:     BP 120/74 (Cuff Size: Large)   Pulse 100   Temp 98 F (36.7 C) (Oral)   Resp 16   Ht 5\' 4"  (1.626 m)   Wt 168 lb 14.4 oz (76.6 kg)   SpO2 99%   BMI 28.99 kg/m  BP Readings  from Last 3 Encounters:  04/05/24 120/74  03/29/24 132/84  02/11/24 128/82   Wt Readings from Last 3 Encounters:  04/05/24 168 lb 14.4 oz (76.6 kg)  03/29/24 164 lb 8 oz (74.6 kg)  02/11/24 171 lb 8 oz (77.8 kg)      Physical Exam Constitutional:      Appearance: Normal appearance.  HENT:     Head: Normocephalic and atraumatic.  Eyes:     Conjunctiva/sclera: Conjunctivae normal.  Cardiovascular:     Rate and Rhythm: Normal rate and regular rhythm.  Pulmonary:     Effort: Pulmonary effort is normal.     Breath sounds: Normal breath sounds.  Abdominal:     General: Bowel sounds are normal. There is no distension.     Palpations: Abdomen is soft.     Tenderness: There is abdominal tenderness. There is no guarding or rebound.     Comments: Epigastric ptp but mildly improved  Skin:    General: Skin is warm and dry.  Neurological:     General: No focal deficit present.     Mental Status: She is alert. Mental status is at baseline.  Psychiatric:        Mood and Affect: Mood normal.        Behavior: Behavior normal.      No results found for any visits on 04/05/24.   Last CBC Lab Results  Component Value Date   WBC 6.6 12/02/2023   HGB 14.9 12/02/2023   HCT 46.9 (H) 12/02/2023   MCV 97 12/02/2023   MCH 30.8 12/02/2023   RDW 12.1 12/02/2023   PLT 204 12/02/2023   Last metabolic panel Lab Results  Component Value Date   GLUCOSE 422 (H) 03/29/2024   NA 133 (L) 03/29/2024   K 4.8 03/29/2024   CL 97 (L) 03/29/2024   CO2 27 03/29/2024   BUN 8 03/29/2024   CREATININE 0.69 03/29/2024   EGFR 107 03/29/2024   CALCIUM 10.3 (H) 03/29/2024   PROT 7.1 12/02/2023   ALBUMIN 4.6 12/02/2023   LABGLOB 2.5 12/02/2023   BILITOT 0.6 12/02/2023   ALKPHOS 99 12/02/2023   AST 93 (H) 12/02/2023   ALT 91 (H) 12/02/2023   Last lipids No results found for: "CHOL", "HDL", "LDLCALC", "LDLDIRECT", "TRIG", "CHOLHDL" Last hemoglobin A1c Lab Results  Component Value Date   HGBA1C  11.7 (H) 12/02/2023   Last thyroid functions Lab Results  Component Value Date   TSH 4.440 12/02/2023   Last vitamin D No results found for: "25OHVITD2", "25OHVITD3", "VD25OH" Last vitamin B12 and Folate No results found for: "VITAMINB12", "FOLATE"    The ASCVD Risk score (Arnett DK, et al., 2019) failed to calculate for the following reasons:   Risk score cannot be calculated because patient has a medical history suggesting prior/existing ASCVD    Assessment & Plan:   Assessment & Plan Type 2 diabetes mellitus with hyperglycemia Severe hyperglycemia with blood glucose at 440 mg/dL, indicating poor control. Current insulin  regimen insufficient. Symptoms include polydipsia and polyuria. Insulin  needs may decrease as  pancreatitis resolves. - Increase Lantus  to 40 units at bedtime. - Monitor fasting blood glucose daily. - Send MyChart message with glucose readings in one week. - Avoid high-fat foods.  Acute pancreatitis Recent hospitalization with lipase levels initially over 4000 U/L, now 3200 U/L. Abdominal pain persists but improved. Monitoring lipase levels is necessary. Avoid high-fat foods to prevent further inflammation. - Recheck lipase levels today. - Consider GI referral if lipase levels remain elevated.  COPD Dyspnea likely exacerbated by recent illness and environmental factors. Albuterol inhaler to be resumed. Incentive spirometer recommended to improve lung function. - Prescribe albuterol inhaler. - Use albuterol inhaler every 4-6 hours as needed for dyspnea or wheezing. - Provide incentive spirometer for home use. - Instruct on use of incentive spirometer.  Encounter for Forms:  -FMLA filled out to return next week 04/12/24 with restrictions like not lifting above 10 pounds and needs a break if standing or walking more than 30 minutes.   - Lipase - Basic Metabolic Panel (BMET) - LANTUS  SOLOSTAR 100 UNIT/ML Solostar Pen; Inject 40 Units into the skin at  bedtime. - albuterol (VENTOLIN HFA) 108 (90 Base) MCG/ACT inhaler; Inhale 2 puffs into the lungs every 6 (six) hours as needed for wheezing or shortness of breath.  Dispense: 8 g; Refill: 2   Return in about 4 weeks (around 05/03/2024).    Rockney Cid, DO

## 2024-04-06 ENCOUNTER — Ambulatory Visit: Payer: Self-pay | Admitting: *Deleted

## 2024-04-06 LAB — BASIC METABOLIC PANEL WITH GFR
BUN: 9 mg/dL (ref 7–25)
CO2: 27 mmol/L (ref 20–32)
Calcium: 9.9 mg/dL (ref 8.6–10.2)
Chloride: 91 mmol/L — ABNORMAL LOW (ref 98–110)
Creat: 0.85 mg/dL (ref 0.50–0.99)
Glucose, Bld: 645 mg/dL (ref 65–99)
Potassium: 4.9 mmol/L (ref 3.5–5.3)
Sodium: 128 mmol/L — ABNORMAL LOW (ref 135–146)
eGFR: 84 mL/min/{1.73_m2} (ref >=60)

## 2024-04-06 LAB — LIPASE: Lipase: 422 U/L — ABNORMAL HIGH (ref 7–60)

## 2024-04-06 NOTE — Telephone Encounter (Signed)
 Agent called with critical lab results from [facility].   I let her know lab results go straight to the practice unless it's lunch or after hours.   She will notify the practice.

## 2024-04-06 NOTE — Telephone Encounter (Signed)
 Copied from CRM [id number]. Topic: Clinical - Lab/Test Results >> Apr 06, 2024 10:13 AM Hassie Lint wrote: Reason for CRM: Josiah Nigh from [facility] has critical Lab results for patient. Warm transferred to NT.

## 2024-04-13 ENCOUNTER — Encounter: Payer: Self-pay | Admitting: Internal Medicine

## 2024-04-14 ENCOUNTER — Encounter: Payer: Self-pay | Admitting: Internal Medicine

## 2024-04-14 ENCOUNTER — Telehealth: Payer: Self-pay

## 2024-04-14 NOTE — Telephone Encounter (Signed)
Letter faxed to patient.

## 2024-04-14 NOTE — Telephone Encounter (Signed)
 Copied from CRM [id number]. Topic: General - Other >> Apr 14, 2024 10:21 AM Hobson Luna F wrote: Reason for CRM: Patient is calling in because she needs more details on her restrictions for her FMLA paperwork. Patient sent a MyChart message yesterday with her specific restrictions and wanted to know if Dr. Bud Care could update the FMLA and then send her the updated form so she can give it to her employer. Please follow up with patient.

## 2024-04-14 NOTE — Telephone Encounter (Signed)
 Note ready for pick up

## 2024-04-15 ENCOUNTER — Encounter: Payer: Self-pay | Admitting: Behavioral Health

## 2024-04-15 ENCOUNTER — Ambulatory Visit: Admitting: Behavioral Health

## 2024-04-15 DIAGNOSIS — F902 Attention-deficit hyperactivity disorder, combined type: Secondary | ICD-10-CM | POA: Diagnosis not present

## 2024-04-15 DIAGNOSIS — G4700 Insomnia, unspecified: Secondary | ICD-10-CM

## 2024-04-15 DIAGNOSIS — F319 Bipolar disorder, unspecified: Secondary | ICD-10-CM

## 2024-04-15 DIAGNOSIS — F411 Generalized anxiety disorder: Secondary | ICD-10-CM

## 2024-04-15 DIAGNOSIS — F4310 Post-traumatic stress disorder, unspecified: Secondary | ICD-10-CM

## 2024-04-15 MED ORDER — AMPHETAMINE-DEXTROAMPHET ER 25 MG PO CP24: 25.0000 mg | ORAL_CAPSULE | ORAL | 0 refills | Status: DC

## 2024-04-15 MED ORDER — HYDROXYZINE HCL 25 MG PO TABS: ORAL_TABLET | ORAL | 1 refills | Status: DC

## 2024-04-15 MED ORDER — FANAPT 2 MG PO TABS: 2.0000 mg | ORAL_TABLET | Freq: Every evening | ORAL | 3 refills | Status: DC

## 2024-04-15 MED ORDER — PREGABALIN 200 MG PO CAPS
200.0000 mg | ORAL_CAPSULE | Freq: Three times a day (TID) | ORAL | 5 refills | Status: DC
Start: 2024-04-15 — End: 2024-07-29

## 2024-04-15 MED ORDER — ZOLPIDEM TARTRATE 10 MG PO TABS: ORAL_TABLET | ORAL | 2 refills | Status: DC

## 2024-04-15 NOTE — Progress Notes (Signed)
 [facility]  Patient ID: Julie Abbott,  MRN: [id number]  PCP: Rockney Cid, DO  Date of Evaluation: 04/15/2024 Time spent:30 minutes  Chief Complaint:  Chief Complaint   Depression; Anxiety; ADHD; Follow-up; Medication Refill; Patient Education     HISTORY/CURRENT STATUS: HPI Julie Abbott presents to the office today for follow-up of anxiety, Bipolar disorder, ADHD,  "Says that she feels very anxious today and irritable. Has been off Adderall  because she was not able to fill for 3 week.  Say, "when I feel like I'm just spinning my wheels, my anxiety goes through the roof".  Says that she has not experienced full blown mania in at least a couple years.Says that she stays depressed most of the time. For now she does not want to adjust medication and agrees to 3 month follow up. Continue to work for [employer] which is high stress job.   She will be at her job for a year in April. 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 She reports working yesterday over the holiday to try to not be as behind. She reports working long hours and weekends in high stress environment.      Ambien  last filled 11/23/23 x 2.  Pregabalin  last filled 11/15/23 x 2 Adderall  last filled 10/23/23 Individual Medical History/ Review of Systems: Changes? :No   Allergies: Bee venom, Gabapentin, Lisinopril, Lortab [hydrocodone-acetaminophen], and Ozempic (0.25 or 0.5 mg-dose) [semaglutide(0.25 or 0.5mg -dos)]  Current Medications:  Current Outpatient Medications:    amphetamine -dextroamphetamine (ADDERALL  XR) 25 MG 24 hr capsule, Take 1 capsule by mouth every morning., Disp: 30 capsule, Rfl: 0   albuterol  (VENTOLIN  HFA) 108 (90 Base) MCG/ACT inhaler, Inhale 2 puffs into the lungs every 6 (six) hours as needed for wheezing or shortness of breath., Disp: 8 g, Rfl: 2   amphetamine -dextroamphetamine (ADDERALL  XR) 20 MG 24 hr capsule, Take 1 capsule (20 mg total) by mouth daily. (Patient not taking: Reported on  04/05/2024), Disp: 30 capsule, Rfl: 0   amphetamine -dextroamphetamine (ADDERALL  XR) 20 MG 24 hr capsule, Take 1 capsule (20 mg total) by mouth daily. (Patient not taking: Reported on 04/05/2024), Disp: 30 capsule, Rfl: 0   amphetamine -dextroamphetamine (ADDERALL  XR) 20 MG 24 hr capsule, Take 1 capsule (20 mg total) by mouth daily. (Patient not taking: Reported on 04/05/2024), Disp: 30 capsule, Rfl: 0   clindamycin  (CLEOCIN ) 2 % vaginal cream, Place 1 Applicatorful vaginally at bedtime. (Patient not taking: Reported on 04/05/2024), Disp: 40 g, Rfl: 0   hydrOXYzine  (ATARAX ) 25 MG tablet, TAKE 1 TABLET BY MOUTH EVERYDAY AT BEDTIME, Disp: 90 tablet, Rfl: 1   Iloperidone  (FANAPT ) 1 MG TABS, Take 1 tablet at bedtime for 4 nights, then may increase to 2 tablets at bedtime if needed/tolerated (Patient not taking: Reported on 04/05/2024), Disp: 28 tablet, Rfl: 0   Iloperidone  (FANAPT ) 1 MG TABS, Take 1-2 tablets at bedtime (Patient not taking: Reported on 02/11/2024), Disp: 60 tablet, Rfl: 2   insulin  lispro (HUMALOG) 100 UNIT/ML KwikPen, Inject 10 Units into the skin 3 (three) times daily., Disp: , Rfl:    Insulin  Pen Needle 32G X 6 MM MISC, 1 each by Does not apply route daily. (Patient not taking: Reported on 02/11/2024), Disp: 100 each, Rfl: 1   LANTUS  SOLOSTAR 100 UNIT/ML Solostar Pen, Inject 40 Units into the skin at bedtime., Disp: , Rfl:    losartan  (COZAAR ) 25 MG tablet, Take 1 tablet (25 mg total) by mouth daily., Disp: 90 tablet, Rfl: 0   pregabalin  (LYRICA ) 200  MG capsule, TAKE 1 CAPSULE (200 MG TOTAL) BY MOUTH 3 (THREE) TIMES DAILY., Disp: 90 capsule, Rfl: 5   zolpidem  (AMBIEN ) 10 MG tablet, TAKE 1/2 TO 1 TABLET BY MOUTH AT BEDTIME, Disp: 30 tablet, Rfl: 2 Medication Side Effects: none  Family Medical/ Social History: Changes? No  MENTAL HEALTH EXAM:  There were no vitals taken for this visit.There is no height or weight on file to calculate BMI.  General Appearance: Casual  Eye Contact:  Good  Speech:   Clear and Coherent  Volume:  Normal  Mood:  Anxious and Irritable  Affect:  Anxious  Thought Process:  Coherent  Orientation:  Full (Time, Place, and Person)  Thought Content: WDL   Suicidal Thoughts:  No  Homicidal Thoughts:  No  Memory:  WNL  Judgement:  Good  Insight:  Good  Psychomotor Activity:  Normal  Concentration:  Concentration: Good  Recall:  Good  Fund of Knowledge: Good  Language: Good  Assets:  Desire for Improvement  ADL's:  Intact  Cognition: WNL  Prognosis:  Good    DIAGNOSES:    ICD-10-CM   1. Attention deficit hyperactivity disorder (ADHD), combined type  F90.2 amphetamine -dextroamphetamine (ADDERALL  XR) 25 MG 24 hr capsule    2. PTSD (post-traumatic stress disorder)  F43.10     3. Bipolar affective disorder, remission status unspecified (HCC)  F31.9     4. Insomnia, unspecified type  G47.00     5. GAD (generalized anxiety disorder)  F41.1       Receiving Psychotherapy: No    RECOMMENDATIONS:   Greater than 50% of  30 min face to face time with patient was spent on counseling and coordination of care. Discussed her current stability since last visit. She is very anxious and irritable today. Was recently hospitalized 4/25 for pancreatitis. Temporarily off medication but never restarted her Adderall . She ran out and did not refill.  Discussed potential benefits, risks, and side effects of Fanapt  for mood stabilization, anxiety, and insomnia. Reviewed potential adverse effects with atypical antipsychotics. Discussed that Fanapt  has less risk of metabolic side effects compared to many other atypical antipsychotics. Continue  to  Fanapt  2 mg at bedtime  daily Reviewed provider's comments regarding lab results and encouraged pt to follow-up with medical providers as soon as possible regarding glucose levels. Will continue Wellbutrin  XL 300 mg daily for depression.  Continue Hydroxyzine  25 mg at bedtime for insomnia and anxiety.  Continue Ambien  10 mg  1/2-1 tablet at bedtime for insomnia. Increase  Adderall  XR 25 mg daily for ADHD.  Patient advised to contact office with any questions, adverse effects, or acute worsening in signs and symptoms. To follow up in 3 months per pt Provided emergency contact information Reviewed PDMP       Lincoln Renshaw, NP

## 2024-05-04 ENCOUNTER — Ambulatory Visit (INDEPENDENT_AMBULATORY_CARE_PROVIDER_SITE_OTHER): Admitting: Internal Medicine

## 2024-05-04 ENCOUNTER — Other Ambulatory Visit: Payer: Self-pay

## 2024-05-04 ENCOUNTER — Encounter: Payer: Self-pay | Admitting: Internal Medicine

## 2024-05-04 VITALS — BP 120/80 | HR 100 | Temp 98.2°F | Resp 16 | Ht 64.0 in | Wt 174.6 lb

## 2024-05-04 DIAGNOSIS — E1165 Type 2 diabetes mellitus with hyperglycemia: Secondary | ICD-10-CM

## 2024-05-04 DIAGNOSIS — Z794 Long term (current) use of insulin: Secondary | ICD-10-CM | POA: Diagnosis not present

## 2024-05-04 MED ORDER — TOUJEO MAX SOLOSTAR 300 UNIT/ML ~~LOC~~ SOPN: 32.0000 [IU] | PEN_INJECTOR | Freq: Every day | SUBCUTANEOUS | 1 refills | Status: DC

## 2024-05-04 NOTE — Progress Notes (Signed)
 Established Patient Office Visit  Subjective   Patient ID: Julie Abbott, female    DOB: 1976/07/22  Age: 48 y.o. MRN: [id number]  Chief Complaint  Patient presents with   Follow-up    4 week follow up labs    HPI  Patient is here for follow up on diabetes after recent episode of pancreatitis in April.   Discussed the use of AI scribe software for clinical note transcription with the patient, who gave verbal consent to proceed.  History of Present Illness Julie Abbott is a 48 year old female with diabetes and pancreatitis who presents for management of blood sugar levels.  She manages her diabetes with Lantus  40 units at bedtime and Humalog 10 units before meals. Despite this, her blood sugar drops to 220 mg/dL at 2 AM and remains elevated throughout the day. She struggles with regular eating and has experienced side effects with metformin, sulfonylureas, and severe vaginal infections with Jardiance. Her A1c was 12.1% in April. She has been on metformin for about 20 years and has tried various diabetes medications, but many have caused adverse effects. Insurance issues affect her medication coverage, with [employer] as her primary and [employer] as secondary, which is under investigation for coverage disputes. She is concerned about her history of pancreatitis, having experienced two episodes.   Diabetes, Type 2: -Last A1c 12.1 4/25 -Diagnosed with type 2 diabetes when she was 48 years old after being diagnosed with PCOS as well  -Medications: now on Lantus  40 units at bedtime, Humalog 10 units before meals but not eating regularly  -Had been on Metformin for 15 years but had GI side effects and wasn't working well, had also been on sulfonylureas which didn't work, has been on insulin  since 48 years old. Did well with Victoza in terms of sugars but was lightheaded, Ozempic caused pancreatitis last year. Jardiance cause vaginal infections   -Patient is compliant with the  above medications and reports no side effects.  -Checking BG at home: has dexcom 7 - fasting sugar >400 -Diet: tries to stay well hydrated, drinking juice, trying to eat protein -Eye exam: UTD -Foot exam: UTD -Microalbumin: UTD -Statin: no -PNA vaccine: UTD -Denies symptoms of hypoglycemia, polyuria, polydipsia, foot ulcers/trauma.   Bipolar/PTSD/ADHD: -Following with Psychiatry  -Currently on Iloperidone  1 mg, Wellbutrin  300 mg XL, Adderall  20 mg XL once daily hydroxyzine  as needed and 10 mg, Lyrica  200 mg TID   Review of Systems  Gastrointestinal:  Negative for abdominal pain, nausea and vomiting.      Objective:     BP 120/80 (Cuff Size: Normal)   Pulse 100   Temp 98.2 F (36.8 C) (Oral)   Resp 16   Ht 5\' 4"  (1.626 m)   Wt 174 lb 9.6 oz (79.2 kg)   SpO2 99%   BMI 29.97 kg/m  BP Readings from Last 3 Encounters:  05/04/24 120/80  04/05/24 120/74  03/29/24 132/84   Wt Readings from Last 3 Encounters:  05/04/24 174 lb 9.6 oz (79.2 kg)  04/05/24 168 lb 14.4 oz (76.6 kg)  03/29/24 164 lb 8 oz (74.6 kg)      Physical Exam Constitutional:      Appearance: Normal appearance.  HENT:     Head: Normocephalic and atraumatic.  Eyes:     Conjunctiva/sclera: Conjunctivae normal.  Cardiovascular:     Rate and Rhythm: Normal rate and regular rhythm.  Pulmonary:     Effort: Pulmonary effort is normal.  Breath sounds: Normal breath sounds.  Skin:    General: Skin is warm and dry.  Neurological:     General: No focal deficit present.     Mental Status: She is alert. Mental status is at baseline.  Psychiatric:        Mood and Affect: Mood normal.        Behavior: Behavior normal.      No results found for any visits on 05/04/24.   Last CBC Lab Results  Component Value Date   WBC 6.6 12/02/2023   HGB 14.9 12/02/2023   HCT 46.9 (H) 12/02/2023   MCV 97 12/02/2023   MCH 30.8 12/02/2023   RDW 12.1 12/02/2023   PLT 204 12/02/2023   Last metabolic  panel Lab Results  Component Value Date   GLUCOSE 645 (HH) 04/05/2024   NA 128 (L) 04/05/2024   K 4.9 04/05/2024   CL 91 (L) 04/05/2024   CO2 27 04/05/2024   BUN 9 04/05/2024   CREATININE 0.85 04/05/2024   EGFR 84 04/05/2024   CALCIUM 9.9 04/05/2024   PROT 7.1 12/02/2023   ALBUMIN 4.6 12/02/2023   LABGLOB 2.5 12/02/2023   BILITOT 0.6 12/02/2023   ALKPHOS 99 12/02/2023   AST 93 (H) 12/02/2023   ALT 91 (H) 12/02/2023   Last lipids No results found for: "CHOL", "HDL", "LDLCALC", "LDLDIRECT", "TRIG", "CHOLHDL" Last hemoglobin A1c Lab Results  Component Value Date   HGBA1C 11.7 (H) 12/02/2023   Last thyroid functions Lab Results  Component Value Date   TSH 4.440 12/02/2023   Last vitamin D No results found for: "25OHVITD2", "25OHVITD3", "VD25OH" Last vitamin B12 and Folate No results found for: "VITAMINB12", "FOLATE"    The ASCVD Risk score (Arnett DK, et al., 2019) failed to calculate for the following reasons:   Risk score cannot be calculated because patient has a medical history suggesting prior/existing ASCVD    Assessment & Plan:   Assessment & Plan Diabetes Mellitus Type 2 with hyperglycemia  Diabetes poorly controlled with A1c of 12.1%. Current regimen includes Lantus  and Humalog. Previous medications not tolerated. Considering switch to Toujeo for improved control and compliance. Initial dose conversion from Lantus  to Toujeo is 32 units. Insurance coverage for Toujeo uncertain. - Discontinue Lantus . - Initiate Toujeo 32 units at bedtime. - Monitor fasting blood glucose and report after 2-3 weeks. - Recheck A1c in 2 months.  Hx of Pancreatitis Recurrent pancreatitis with recent episode resolving.  Avoid medications that stimulate the pancreas. GLP-1s contraindicated due to risk of recurrence and pseudocyst formation. - Avoid medications that stimulate the pancreas.  Follow-up Plan to reassess diabetes management and A1c after trial of Toujeo. Integrate  continuous glucose monitoring data for enhanced management. - Schedule follow-up appointment for end of July or beginning of August. - Send fasting blood glucose readings via MyChart after 2-3 weeks on Toujeo. - Integrate Dexcom data into clinic system.  - insulin  glargine, 2 Unit Dial, (TOUJEO MAX SOLOSTAR) 300 UNIT/ML Solostar Pen; Inject 32 Units into the skin daily.  Dispense: 3.2 mL; Refill: 1   Return in about 2 months (around 07/04/2024).    Rockney Cid, DO

## 2024-05-04 NOTE — Addendum Note (Signed)
 Addended by: Rockney Cid on: 05/04/2024 08:34 AM   Modules accepted: Level of Service

## 2024-05-15 ENCOUNTER — Other Ambulatory Visit: Payer: Self-pay | Admitting: Internal Medicine

## 2024-05-15 DIAGNOSIS — Z794 Long term (current) use of insulin: Secondary | ICD-10-CM

## 2024-05-15 DIAGNOSIS — R809 Proteinuria, unspecified: Secondary | ICD-10-CM

## 2024-05-17 NOTE — Telephone Encounter (Signed)
 Requested Prescriptions  Pending Prescriptions Disp Refills   losartan  (COZAAR ) 25 MG tablet [Pharmacy Med Name: LOSARTAN  POTASSIUM 25 MG TAB] 90 tablet 1    Sig: TAKE 1 TABLET (25 MG TOTAL) BY MOUTH DAILY.     Cardiovascular:  Angiotensin Receptor Blockers Passed - 05/17/2024  5:23 PM      Passed - Cr in normal range and within 180 days    Creat  Date Value Ref Range Status  04/05/2024 0.85 0.50 - 0.99 mg/dL Final   Creatinine, Urine  Date Value Ref Range Status  02/11/2024 35 20 - 275 mg/dL Final         Passed - K in normal range and within 180 days    Potassium  Date Value Ref Range Status  04/05/2024 4.9 3.5 - 5.3 mmol/L Final         Passed - Patient is not pregnant      Passed - Last BP in normal range    BP Readings from Last 1 Encounters:  05/04/24 120/80         Passed - Valid encounter within last 6 months    Recent Outpatient Visits           1 week ago Type 2 diabetes mellitus with hyperglycemia, with long-term current use of insulin  [facility])   [facility] Rockney Cid, DO   1 month ago Acute pancreatitis without infection or necrosis, unspecified pancreatitis type   [facility] Rockney Cid, DO   1 month ago Acute pancreatitis without infection or necrosis, unspecified pancreatitis type   [facility] Rockney Cid, DO   3 months ago Type 2 diabetes mellitus with hyperglycemia, with long-term current use of insulin  [facility])   [facility] Rockney Cid, DO       Future Appointments             In 1 month Rockney Cid, DO [facility], [facility]

## 2024-05-19 ENCOUNTER — Encounter: Payer: Self-pay | Admitting: Internal Medicine

## 2024-05-20 ENCOUNTER — Other Ambulatory Visit: Payer: Self-pay | Admitting: Internal Medicine

## 2024-05-20 DIAGNOSIS — K219 Gastro-esophageal reflux disease without esophagitis: Secondary | ICD-10-CM

## 2024-05-20 MED ORDER — PANTOPRAZOLE SODIUM 40 MG PO TBEC: 40.0000 mg | DELAYED_RELEASE_TABLET | Freq: Every day | ORAL | 0 refills | Status: DC

## 2024-05-31 ENCOUNTER — Other Ambulatory Visit: Payer: Self-pay

## 2024-05-31 ENCOUNTER — Telehealth: Payer: Self-pay | Admitting: Behavioral Health

## 2024-05-31 DIAGNOSIS — F902 Attention-deficit hyperactivity disorder, combined type: Secondary | ICD-10-CM

## 2024-05-31 NOTE — Telephone Encounter (Signed)
 Pt called and said that she needs a refill on her adderall  xr 25 mg. Pharmacy is [facility] in [place]. Next appt is in august

## 2024-05-31 NOTE — Telephone Encounter (Signed)
 Pended Adderall  XR 25 to [facility] in [place].

## 2024-06-01 MED ORDER — AMPHETAMINE-DEXTROAMPHET ER 25 MG PO CP24: 25.0000 mg | ORAL_CAPSULE | ORAL | 0 refills | Status: DC

## 2024-06-01 MED ORDER — AMPHETAMINE-DEXTROAMPHET ER 25 MG PO CP24
25.0000 mg | ORAL_CAPSULE | ORAL | 0 refills | Status: DC
Start: 2024-06-28 — End: 2024-07-29

## 2024-07-02 ENCOUNTER — Ambulatory Visit (INDEPENDENT_AMBULATORY_CARE_PROVIDER_SITE_OTHER): Admitting: Internal Medicine

## 2024-07-02 ENCOUNTER — Encounter: Payer: Self-pay | Admitting: Internal Medicine

## 2024-07-02 ENCOUNTER — Other Ambulatory Visit: Payer: Self-pay

## 2024-07-02 VITALS — BP 110/68 | HR 98 | Temp 97.8°F | Resp 16 | Ht 65.0 in | Wt 170.0 lb

## 2024-07-02 DIAGNOSIS — Z8719 Personal history of other diseases of the digestive system: Secondary | ICD-10-CM

## 2024-07-02 DIAGNOSIS — M545 Low back pain, unspecified: Secondary | ICD-10-CM | POA: Diagnosis not present

## 2024-07-02 DIAGNOSIS — G8929 Other chronic pain: Secondary | ICD-10-CM

## 2024-07-02 DIAGNOSIS — Z794 Long term (current) use of insulin: Secondary | ICD-10-CM

## 2024-07-02 DIAGNOSIS — E1165 Type 2 diabetes mellitus with hyperglycemia: Secondary | ICD-10-CM

## 2024-07-02 DIAGNOSIS — Z91030 Bee allergy status: Secondary | ICD-10-CM

## 2024-07-02 LAB — POCT GLYCOSYLATED HEMOGLOBIN (HGB A1C): Hemoglobin A1C: 13 % — AB (ref 4.0–5.6)

## 2024-07-02 MED ORDER — INSULIN LISPRO (1 UNIT DIAL) 100 UNIT/ML (KWIKPEN): 10.0000 [IU] | PEN_INJECTOR | Freq: Three times a day (TID) | SUBCUTANEOUS | 3 refills | Status: DC

## 2024-07-02 MED ORDER — EPINEPHRINE 0.3 MG/0.3ML IJ SOAJ: 0.3000 mg | INTRAMUSCULAR | 3 refills | Status: AC | PRN

## 2024-07-02 MED ORDER — FREESTYLE LIBRE 3 PLUS SENSOR MISC: 3 refills | Status: DC

## 2024-07-02 NOTE — Progress Notes (Signed)
 Established Patient Office Visit  Subjective   Patient ID: Julie Abbott, female    DOB: 06/15/1976  Age: 48 y.o. MRN: [id number]  Chief Complaint  Patient presents with   Medical Management of Chronic Issues   Back Pain    Back Pain Pertinent negatives include no abdominal pain.    Patient is here for follow up on diabetes after recent episode of pancreatitis in April.   Discussed the use of AI scribe software for clinical note transcription with the patient, who gave verbal consent to proceed.  History of Present Illness Julie Abbott is a 48 year old female with type 2 diabetes and recurrent pancreatitis who presents with elevated A1c levels.  Her A1c level is elevated at 13.0. She inconsistently uses her insulin , Toujeo , due to forgetfulness and fatigue, and is attempting to set alarms for reminders. She uses Toujeo  and Humalog, both at night, but is considering taking Toujeo  in the morning. Humalog use is irregular due to her meal pattern of one large evening meal.  She has issues with her Dexcom continuous glucose monitor and has run out of supplies. She suspects inaccurate readings from the device.  Her diet consists of one meal a day, usually dinner, often casseroles prepared on weekends. Her work schedule from 8 AM to 6 PM affects her meal and medication timing.  Recurrent pancreatitis limits her diabetes medication options. She has experienced side effects with Jardiance and is cautious about similar medications due to the risk of urinary tract infections and genital yeast infections.  She needs a new EpiPen due to expired medication and a recent encounter with hornets. She has severe allergic reactions to bee stings.  She has a history of a failed neck fusion surgery in 2015, leading to herniated discs in her lower back. She recently experienced significant back pain after hearing a 'crack' while getting out of bed, causing her lower back to give out.   Diabetes,  Type 2: -Last A1c 12.1 4/25 -Diagnosed with type 2 diabetes when she was 48 years old after being diagnosed with PCOS as well  -Medications: now on Toujeo  32 units at bedtime, Humalog 10 units before meals but not eating regularly  -Had been on Metformin for 15 years but had GI side effects and wasn't working well, had also been on sulfonylureas which didn't work, has been on insulin  since 48 years old. Did well with Victoza in terms of sugars but was lightheaded, Ozempic caused pancreatitis last year. Jardiance cause vaginal infections   -Patient is compliant with the above medications and reports no side effects.  -Checking BG at home: has dexcom 7 - fasting sugar >400 but not checking recently, had an issue with the Dexcom -Diet: tries to stay well hydrated, drinking juice, trying to eat protein -Eye exam: UTD -Foot exam: UTD -Microalbumin: UTD -Statin: no -PNA vaccine: UTD -Denies symptoms of hypoglycemia, polyuria, polydipsia, foot ulcers/trauma.   Bipolar/PTSD/ADHD: -Following with Psychiatry  -Currently on Iloperidone  1 mg, Wellbutrin  300 mg XL, Adderall  20 mg XL once daily hydroxyzine  as needed and 10 mg, Lyrica  200 mg TID   Review of Systems  Gastrointestinal:  Negative for abdominal pain, nausea and vomiting.  Musculoskeletal:  Positive for back pain.      Objective:     BP 110/68 (Cuff Size: Large)   Pulse 98   Temp 97.8 F (36.6 C) (Oral)   Resp 16   Ht 5' 5 (1.651 m)   Wt 170 lb (77.1 kg)  SpO2 97%   BMI 28.29 kg/m  BP Readings from Last 3 Encounters:  07/02/24 110/68  05/04/24 120/80  04/05/24 120/74   Wt Readings from Last 3 Encounters:  07/02/24 170 lb (77.1 kg)  05/04/24 174 lb 9.6 oz (79.2 kg)  04/05/24 168 lb 14.4 oz (76.6 kg)      Physical Exam Constitutional:      Appearance: Normal appearance.  HENT:     Head: Normocephalic and atraumatic.  Eyes:     Conjunctiva/sclera: Conjunctivae normal.  Cardiovascular:     Rate and Rhythm: Normal  rate and regular rhythm.  Pulmonary:     Effort: Pulmonary effort is normal.     Breath sounds: Normal breath sounds.  Skin:    General: Skin is warm and dry.  Neurological:     General: No focal deficit present.     Mental Status: She is alert. Mental status is at baseline.  Psychiatric:        Mood and Affect: Mood normal.        Behavior: Behavior normal.      No results found for any visits on 07/02/24.   Last CBC Lab Results  Component Value Date   WBC 6.6 12/02/2023   HGB 14.9 12/02/2023   HCT 46.9 (H) 12/02/2023   MCV 97 12/02/2023   MCH 30.8 12/02/2023   RDW 12.1 12/02/2023   PLT 204 12/02/2023   Last metabolic panel Lab Results  Component Value Date   GLUCOSE 645 (HH) 04/05/2024   NA 128 (L) 04/05/2024   K 4.9 04/05/2024   CL 91 (L) 04/05/2024   CO2 27 04/05/2024   BUN 9 04/05/2024   CREATININE 0.85 04/05/2024   EGFR 84 04/05/2024   CALCIUM 9.9 04/05/2024   PROT 7.1 12/02/2023   ALBUMIN 4.6 12/02/2023   LABGLOB 2.5 12/02/2023   BILITOT 0.6 12/02/2023   ALKPHOS 99 12/02/2023   AST 93 (H) 12/02/2023   ALT 91 (H) 12/02/2023   Last lipids No results found for: CHOL, HDL, LDLCALC, LDLDIRECT, TRIG, CHOLHDL Last hemoglobin A1c Lab Results  Component Value Date   HGBA1C 11.7 (H) 12/02/2023   Last thyroid functions Lab Results  Component Value Date   TSH 4.440 12/02/2023   Last vitamin D No results found for: 25OHVITD2, 25OHVITD3, VD25OH Last vitamin B12 and Folate No results found for: VITAMINB12, FOLATE    The ASCVD Risk score (Arnett DK, et al., 2019) failed to calculate for the following reasons:   Risk score cannot be calculated because patient has a medical history suggesting prior/existing ASCVD    Assessment & Plan:   Assessment & Plan Type 2 diabetes mellitus with poor glycemic control and insulin  therapy A1c at 13.0 indicates poor control. Brittle diabetic with recurrent pancreatitis limits medication options.  Current insulin  regimen includes Toujeo  and Humalog. Non-adherence due to forgetfulness and irregular eating. Dexcom issues hinder glucose monitoring. Discussed consistent insulin  use and risks of non-adherence. Considered Farxiga if control improves, with risks of UTIs and yeast infections. - Switch to [facility] 3 for glucose monitoring and provide samples. - Submit prior authorization for [employer]. - Adjust Toujeo  to 40 units daily in the morning. - Continue Humalog 10 units with meals, especially main meal. - Refill Humalog and Toujeo  prescriptions. - Educate on Humalog with meals to prevent postprandial hyperglycemia. - Discuss potential Farxiga use if control improves.  History of recurrent pancreatitis Recurrent pancreatitis limits diabetic medication options. Insulin  therapy used to avoid exacerbation. Discussed risks of  pancreatic stimulation by most diabetic medications.  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 Previous surgery unsuccessful, neurosurgical evaluation preferred. - Refer to neurosurgeon Dr. Katrina for evaluation.  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 Increased sting risk due to hornet activity. - Prescribe new EpiPen with refills.  - POCT HgB A1C - Continuous Glucose Sensor (FREESTYLE LIBRE 3 PLUS SENSOR) MISC; Change sensor every 15 days.  Dispense: 2 each; Refill: 3 - insulin  lispro (HUMALOG KWIKPEN) 100 UNIT/ML KwikPen; Inject 10 Units into the skin with breakfast, with lunch, and with evening meal.  Dispense: 9 mL; Refill: 3 - EPINEPHrine 0.3 mg/0.3 mL IJ SOAJ injection; Inject 0.3 mg into the muscle as needed for anaphylaxis.  Dispense: 1 each; Refill: 3 - Ambulatory referral to Neurosurgery   Return in about 3 months (around 10/02/2024).    Sharyle Fischer, DO

## 2024-07-13 ENCOUNTER — Ambulatory Visit (INDEPENDENT_AMBULATORY_CARE_PROVIDER_SITE_OTHER): Payer: Self-pay | Admitting: Behavioral Health

## 2024-07-13 DIAGNOSIS — Z0389 Encounter for observation for other suspected diseases and conditions ruled out: Secondary | ICD-10-CM

## 2024-07-13 NOTE — Progress Notes (Signed)
 Pt did not show for scheduled appt and did not provide 24 hour notice as required. Additional fees to be assessed.

## 2024-07-23 NOTE — Progress Notes (Signed)
 Referring Physician:  Bernardo Fend, DO [address] [place],  [place] [address]  Primary Physician:  Bernardo Fend, DO  History of Present Illness: 07/29/2024 [name] is here today with a chief complaint of COPD, DM, IC, PTSD, ADHD, bipolar, PCOS.   She saw [facility] in [place] in the past. Had cervical fusion in 2015. She's had pain since that time.   She has constant LBP with bilateral posterior leg pain to her knee x years. No numbness, tingling, or weakness in her legs. Had a bad flare up of pain in her back for 3 weeks. Pain is worse with bending, prolonged standing/walking.   She has constant pain in her neck since her surgery. She has intermittent numbness in her arms- they feel numb and weak from elbow to hand at times.   EMG 07/17/21 showed possibility of left lower extremity lumbar radiculopathy changes with particular attention to L5-S1 level. Report scanned into chart.   She smokes 5 cigarettes per day x 35 years.   Bowel/Bladder Dysfunction: she has urinary urgency x years and bowel urgency x 1 year.   Conservative measures:  Physical therapy: none recently Multimodal medical therapy including regular antiinflammatories:  lyrica  Injections: Left L5-S1 TF  11/17/21  Past Surgery:  Cervical Fusion in 2015 by Dr. Cleatus in [place], [place]  Hunter Sebastian has no symptoms of cervical myelopathy.  The symptoms are causing a significant impact on the patient's life.   Review of Systems:  A 10 point review of systems is negative, except for the pertinent positives and negatives detailed in the HPI.  Past Medical History: Past Medical History:  Diagnosis Date   Back pain    Bipolar affective disorder (HCC) 07/18/2021   Cervical spondylosis with myelopathy and radiculopathy 11/10/2021   Chronic bilateral low back pain 07/18/2021   Constipation by outlet dysfunction 04/06/2021   Last Assessment & Plan:    CHRONIC  CONSTIPATION WITH RECENT ONSET OF VOMITING   ?? Blockage/ unknown etiology     LAB RESULTS FROM 04/03/21  -- WBC 5.3  Monitor diet, avoid heavy meals, recommend soft foods, liquid diet until eval by GI  Upcoming GI appt pendig  Advised if develop pain, fever, increase/ worsening symptoms, go to ER     Diabetes mellitus, type II (HCC)    Diabetic polyneuropathy associated with type 2 diabetes mellitus (HCC) 04/06/2021   H/O total hysterectomy 12/10/2021   Hirsutism 04/11/1995   History of fusion of cervical spine 04/10/2022   Interstitial cystitis    Mixed hyperlipidemia 07/18/2021   Other insomnia 07/18/2021   PCOS (polycystic ovarian syndrome)    PTSD (post-traumatic stress disorder) 07/18/2021   Type 2 diabetes mellitus with complication, with long-term current use of insulin  (HCC) 07/18/2021    Past Surgical History: Past Surgical History:  Procedure Laterality Date   ABDOMINAL HYSTERECTOMY     SPINAL FUSION      Allergies: Allergies as of 07/29/2024 - Review Complete 07/29/2024  Allergen Reaction Noted   Bee venom  03/13/2020   Gabapentin  03/13/2020   Lisinopril Cough 12/17/2022   Lortab [hydrocodone-acetaminophen]  03/13/2020   Ozempic (0.25 or 0.5 mg-dose) [semaglutide(0.25 or 0.5mg -dos)] Other (See Comments) 12/02/2023    Medications: Outpatient Encounter Medications as of 07/29/2024  Medication Sig   albuterol  (VENTOLIN  HFA) 108 (90 Base) MCG/ACT inhaler Inhale 2 puffs into the lungs every 6 (six) hours as needed for wheezing or shortness of breath.   amphetamine -dextroamphetamine (ADDERALL   XR) 25 MG 24 hr capsule Take 1 capsule by mouth every morning.   clindamycin  (CLEOCIN ) 2 % vaginal cream Place 1 Applicatorful vaginally at bedtime.   Continuous Glucose Sensor (FREESTYLE LIBRE 3 PLUS SENSOR) MISC Change sensor every 15 days.   EPINEPHrine  0.3 mg/0.3 mL IJ SOAJ injection Inject 0.3 mg into the muscle as needed for anaphylaxis.   Iloperidone  (FANAPT ) 1 MG TABS Take 1  tablet at bedtime for 4 nights, then may increase to 2 tablets at bedtime if needed/tolerated   Iloperidone  (FANAPT ) 1 MG TABS Take 1-2 tablets at bedtime   insulin  glargine, 2 Unit Dial , (TOUJEO  MAX SOLOSTAR) 300 UNIT/ML Solostar Pen Inject 32 Units into the skin daily.   insulin  lispro (HUMALOG  KWIKPEN) 100 UNIT/ML KwikPen Inject 10 Units into the skin with breakfast, with lunch, and with evening meal.   Insulin  Pen Needle 32G X 6 MM MISC 1 each by Does not apply route daily.   losartan  (COZAAR ) 25 MG tablet TAKE 1 TABLET (25 MG TOTAL) BY MOUTH DAILY.   pantoprazole  (PROTONIX ) 40 MG tablet Take 1 tablet (40 mg total) by mouth daily.   zolpidem  (AMBIEN ) 10 MG tablet TAKE 1/2 TO 1 TABLET BY MOUTH AT BEDTIME   [DISCONTINUED] amphetamine -dextroamphetamine (ADDERALL  XR) 25 MG 24 hr capsule Take 1 capsule by mouth every morning.   [DISCONTINUED] hydrOXYzine  (ATARAX ) 25 MG tablet TAKE 1 TABLET BY MOUTH EVERYDAY AT BEDTIME   [DISCONTINUED] Iloperidone  (FANAPT ) 2 MG TABS Take 1 tablet (2 mg total) by mouth at bedtime.   [DISCONTINUED] pregabalin  (LYRICA ) 200 MG capsule Take 1 capsule (200 mg total) by mouth 3 (three) times daily.   No facility-administered encounter medications on file as of 07/29/2024.    Social History: Social History   Tobacco Use   Smoking status: Some Days    Current packs/day: 0.25    Types: Cigarettes    Passive exposure: Current   Smokeless tobacco: Never  Vaping Use   Vaping status: Never Used  Substance Use Topics   Alcohol use: Never   Drug use: Never    Family Medical History: Family History  Problem Relation Age of Onset   Bipolar disorder Daughter    Anxiety disorder Daughter    Depression Mother    Alcohol abuse Mother    Drug abuse Mother    OCD Mother    Panic disorder Mother    Alcohol abuse Maternal Aunt    Polycystic ovary syndrome Paternal Aunt    Alcohol abuse Maternal Uncle    Alcohol abuse Maternal Grandmother    Anxiety disorder Maternal  Grandmother    Depression Maternal Grandmother    Polycystic ovary syndrome Paternal Grandmother     Physical Examination:  There were no vitals filed for this visit.  NEUROLOGICAL:     Awake, alert, oriented to person, place, and time.  Speech is clear and fluent. Fund of knowledge is appropriate.   Cranial Nerves: Pupils equal round and reactive to light.  Facial tone is symmetric.    Lower diffuse posterior lumbar tenderness.   No abnormal lesions on exposed skin.   Strength: Side Biceps Triceps Deltoid Interossei Grip Wrist Ext. Wrist Flex.  R 5 5 5 5 5 5 5   L 5 5 5 5 5 5 5    Side Iliopsoas Quads Hamstring PF DF EHL  R 5 5 5 5 5 5   L 5 5 5 5 5 5    Reflexes are 2+ and symmetric at the biceps, brachioradialis, patella and  achilles.   Hoffman's is absent.  Clonus is not present.   Bilateral upper and lower extremity sensation is intact to light touch.     No pain with IR/ER of both hips.   Gait is slow.    Medical Decision Making  Imaging: Lumbar MRI dated 07/06/21:  FINDINGS: Segmentation:  Standard   Alignment:  Normal   Vertebrae:  Normal bone marrow.  Negative for fracture or mass.   Conus medullaris and cauda equina: Conus extends to the L1-2 level. Conus and cauda equina appear normal.   Paraspinal and other soft tissues: Negative for paraspinous mass or adenopathy.   Disc levels:   L1-2: Mild disc degeneration and Schmorl's node. Negative for stenosis   L2-3: Mild disc degeneration and Schmorl's node. Negative for stenosis   L3-4: Mild disc degeneration and Schmorl's node. Negative for stenosis   L4-5: Mild disc space narrowing and disc bulging. Mild facet degeneration. Negative for stenosis   L5-S1: Small extruded disc fragment to the left of midline. Disc fragment extends caudally. Possible left S1 nerve root impingement.   IMPRESSION: Mild lumbar degenerative changes at multiple levels   Small extruded disc fragment to left midline at  L5-S1. Possible left S1 nerve root impingement.     Electronically Signed   By: Carlin Gaskins M.D.   On: 07/06/2021 11:29   MRI cervical spine dated 06/06/21:  FINDINGS: Alignment: There is straightening of the normal cervical lordosis and trace retrolisthesis C5 on C6 and C6 on C7.   Vertebrae: No fracture, evidence of discitis, or bone lesion. The patient is status post C5-7 anterior fusion. On the prior plain films, there is lucency about the screw in C5 and the screw extends toward the C5-6 disc interspace.   Cord: Normal signal throughout.   Posterior Fossa, vertebral arteries, paraspinal tissues: Negative.   Disc levels:   C2-3: Negative.   C3-4: Negative.   C4-5: There is a shallow disc bulge and mild uncovertebral disease. The central canal and foramina remain open.   C5-6: Status post anterior fusion. A left paracentral endplate spur appears to contact the cord. The foramina open.   C6-7: Status post fusion. Endplate spurring and uncovertebral disease result in the mild deformity of the ventral cord and mild to moderate left foraminal narrowing. The right foramen appears open.   C7-T1: Negative.   IMPRESSION: Status post C5-7 anterior fusion. Based on correlation with the prior plain films, the appearance of C5-6 is worrisome for pseudoarthrosis where there is lucency about the C5 screw on the prior plain films. CT scan of the cervical spine could be used for further evaluation.   Left paracentral endplate spur at C5-6 contacts the ventral cord.   Endplate spurring and uncovertebral disease result in mild deformity of the ventral cord and mild to moderate left foraminal narrowing at C6-7.   Shallow disc bulge C4-5 without stenosis.     Electronically Signed   By: Debby Prader M.D.   On: 06/07/2021 13:07   Cervical xrays dated 02/05/21:  FINDINGS: Mild straightening cervical spine. C5 through C7 anterior fusion. Hardware intact. Anatomic  alignment. Multilevel degenerative change with prominent endplate osteophytes C4 through C7. No acute bony abnormality identified.   IMPRESSION: 1. C5 through C7 anterior fusion. Hardware intact. Anatomic alignment. 2. Multilevel degenerative change with prominent endplate osteophytes C4 through C7.     Electronically Signed   By: Debby  Register   On: 02/06/2021 06:24   I have personally reviewed the images  and agree with the above interpretation. Regarding above cervical xrays, she has ACDF C5-C7. Possible luncency at C5 screw and it is directed toward disc space at C5-C6.    Assessment and Plan: Ms. Yarbro had cervical fusion in 2015. She's had pain since that time.   She has constant LBP with bilateral posterior leg pain to her knee x years. No numbness, tingling, or weakness in her legs.   MRI from 07/06/21 showed disc at L5-S1. EMG 07/17/21 showed possibility of left lower extremity lumbar radiculopathy changes with particular attention to L5-S1 level.   She has constant pain in her neck since her surgery. She has intermittent numbness in her arms- they feel numb and weak from elbow to hand at times.   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  - Cervical xrays with flex/ext when she gets lumbar MRI.  - I reviewed her imaging again after her visit and also recommend CT of cervical spine. Message sent to patient.  - EMG/NCS of bilateral upper extremities to evaluate bilateral intermittent numbness in arms from elbows down. Orders to [facility].  - Once I have above results, will schedule her a follow up with me in clinic to review them.   Thank you for involving me in the care of this patient.   I spent a total of 45 minutes in both face-to-face and non-face-to-face  activities for this visit on the date of this encounter.   Glade Boys PA-C Dept. of Neurosurgery

## 2024-07-27 ENCOUNTER — Ambulatory Visit: Admitting: Physician Assistant

## 2024-07-28 ENCOUNTER — Ambulatory Visit: Admitting: Physician Assistant

## 2024-07-29 ENCOUNTER — Ambulatory Visit: Admitting: Behavioral Health

## 2024-07-29 ENCOUNTER — Encounter: Payer: Self-pay | Admitting: Orthopedic Surgery

## 2024-07-29 ENCOUNTER — Encounter: Payer: Self-pay | Admitting: Behavioral Health

## 2024-07-29 ENCOUNTER — Ambulatory Visit (INDEPENDENT_AMBULATORY_CARE_PROVIDER_SITE_OTHER): Admitting: Orthopedic Surgery

## 2024-07-29 VITALS — Ht 65.0 in | Wt 174.0 lb

## 2024-07-29 DIAGNOSIS — F319 Bipolar disorder, unspecified: Secondary | ICD-10-CM

## 2024-07-29 DIAGNOSIS — M542 Cervicalgia: Secondary | ICD-10-CM | POA: Diagnosis not present

## 2024-07-29 DIAGNOSIS — R202 Paresthesia of skin: Secondary | ICD-10-CM

## 2024-07-29 DIAGNOSIS — F902 Attention-deficit hyperactivity disorder, combined type: Secondary | ICD-10-CM | POA: Diagnosis not present

## 2024-07-29 DIAGNOSIS — M47816 Spondylosis without myelopathy or radiculopathy, lumbar region: Secondary | ICD-10-CM

## 2024-07-29 DIAGNOSIS — Z981 Arthrodesis status: Secondary | ICD-10-CM

## 2024-07-29 DIAGNOSIS — M5416 Radiculopathy, lumbar region: Secondary | ICD-10-CM

## 2024-07-29 DIAGNOSIS — F4310 Post-traumatic stress disorder, unspecified: Secondary | ICD-10-CM

## 2024-07-29 DIAGNOSIS — F411 Generalized anxiety disorder: Secondary | ICD-10-CM | POA: Diagnosis not present

## 2024-07-29 DIAGNOSIS — G4700 Insomnia, unspecified: Secondary | ICD-10-CM

## 2024-07-29 DIAGNOSIS — R200 Anesthesia of skin: Secondary | ICD-10-CM | POA: Diagnosis not present

## 2024-07-29 DIAGNOSIS — M545 Low back pain, unspecified: Secondary | ICD-10-CM

## 2024-07-29 MED ORDER — PREGABALIN 200 MG PO CAPS: 200.0000 mg | ORAL_CAPSULE | Freq: Three times a day (TID) | ORAL | 5 refills | Status: DC

## 2024-07-29 MED ORDER — HYDROXYZINE HCL 25 MG PO TABS: ORAL_TABLET | ORAL | 1 refills | Status: DC

## 2024-07-29 MED ORDER — AMPHETAMINE-DEXTROAMPHET ER 25 MG PO CP24: 25.0000 mg | ORAL_CAPSULE | ORAL | 0 refills | Status: AC

## 2024-07-29 MED ORDER — MIRTAZAPINE 7.5 MG PO TABS: ORAL_TABLET | ORAL | 1 refills | Status: DC

## 2024-07-29 MED ORDER — FANAPT 2 MG PO TABS
2.0000 mg | ORAL_TABLET | Freq: Every evening | ORAL | 3 refills | Status: AC
Start: 2024-07-29 — End: ?

## 2024-07-29 NOTE — Addendum Note (Signed)
 Addended byBETHA HILMA HASTINGS on: 07/29/2024 09:12 PM   Modules accepted: Orders

## 2024-07-29 NOTE — Progress Notes (Signed)
 [facility]  Patient ID: [name],  MRN: [id number]  PCP: Bernardo Fend, DO  Date of Evaluation: 07/29/2024 Time spent:30 minutes  Chief Complaint:   HISTORY/CURRENT STATUS: HPI Julie Abbott presents to the office today for follow-up of anxiety, Bipolar disorder, ADHD,  Says her medication are working ok but she is in pain with lower back and lower cervical spine. She is following up with specialist.    For now she does not want to adjust medication and agrees to 3 month follow up. Continue to work for [employer] which is high stress job.  Will follow up after the holiday to reassess after she can address problems with pain.      Ambien  last filled 11/23/23 x 2.  Pregabalin  last filled 11/15/23 x 2 Adderall  last filled 10/23/23 Individual Medical History/ Review of Systems: Changes? :No   Allergies: Bee venom, Gabapentin, Lisinopril, Lortab [hydrocodone-acetaminophen], and Ozempic (0.25 or 0.5 mg-dose) [semaglutide(0.25 or 0.5mg -dos)]  Current Medications:  Current Outpatient Medications:    mirtazapine  (REMERON ) 7.5 MG tablet, Take 1-2 tablets by mouth daily at bedtime for sleep, Disp: 60 tablet, Rfl: 1   albuterol  (VENTOLIN  HFA) 108 (90 Base) MCG/ACT inhaler, Inhale 2 puffs into the lungs every 6 (six) hours as needed for wheezing or shortness of breath., Disp: 8 g, Rfl: 2   amphetamine -dextroamphetamine (ADDERALL  XR) 25 MG 24 hr capsule, Take 1 capsule by mouth every morning., Disp: 30 capsule, Rfl: 0   amphetamine -dextroamphetamine (ADDERALL  XR) 25 MG 24 hr capsule, Take 1 capsule by mouth every morning., Disp: 30 capsule, Rfl: 0   clindamycin  (CLEOCIN ) 2 % vaginal cream, Place 1 Applicatorful vaginally at bedtime., Disp: 40 g, Rfl: 0   Continuous Glucose Sensor (FREESTYLE LIBRE 3 PLUS SENSOR) MISC, Change sensor every 15 days., Disp: 2 each, Rfl: 3   EPINEPHrine  0.3 mg/0.3 mL IJ SOAJ injection, Inject 0.3 mg into the muscle as needed for anaphylaxis., Disp: 1  each, Rfl: 3   hydrOXYzine  (ATARAX ) 25 MG tablet, TAKE 1 TABLET BY MOUTH EVERYDAY AT BEDTIME, Disp: 90 tablet, Rfl: 1   Iloperidone  (FANAPT ) 1 MG TABS, Take 1 tablet at bedtime for 4 nights, then may increase to 2 tablets at bedtime if needed/tolerated, Disp: 28 tablet, Rfl: 0   Iloperidone  (FANAPT ) 1 MG TABS, Take 1-2 tablets at bedtime, Disp: 60 tablet, Rfl: 2   Iloperidone  (FANAPT ) 2 MG TABS, Take 1 tablet (2 mg total) by mouth at bedtime., Disp: 30 tablet, Rfl: 3   insulin  glargine, 2 Unit Dial , (TOUJEO  MAX SOLOSTAR) 300 UNIT/ML Solostar Pen, Inject 32 Units into the skin daily., Disp: 3.2 mL, Rfl: 1   insulin  lispro (HUMALOG  KWIKPEN) 100 UNIT/ML KwikPen, Inject 10 Units into the skin with breakfast, with lunch, and with evening meal., Disp: 9 mL, Rfl: 3   Insulin  Pen Needle 32G X 6 MM MISC, 1 each by Does not apply route daily., Disp: 100 each, Rfl: 1   losartan  (COZAAR ) 25 MG tablet, TAKE 1 TABLET (25 MG TOTAL) BY MOUTH DAILY., Disp: 90 tablet, Rfl: 1   pantoprazole  (PROTONIX ) 40 MG tablet, Take 1 tablet (40 mg total) by mouth daily., Disp: 90 tablet, Rfl: 0   pregabalin  (LYRICA ) 200 MG capsule, Take 1 capsule (200 mg total) by mouth 3 (three) times daily., Disp: 90 capsule, Rfl: 5   zolpidem  (AMBIEN ) 10 MG tablet, TAKE 1/2 TO 1 TABLET BY MOUTH AT BEDTIME, Disp: 30 tablet, Rfl: 2 Medication Side Effects: none  Family Medical/ Social History: Changes? No  MENTAL HEALTH EXAM:  There were no vitals taken for this visit.There is no height or weight on file to calculate BMI.  General Appearance: Casual  Eye Contact:  Good  Speech:  Clear and Coherent  Volume:  Normal  Mood:  NA  Affect:  Appropriate  Thought Process:  Coherent  Orientation:  Full (Time, Place, and Person)  Thought Content: Logical   Suicidal Thoughts:  No  Homicidal Thoughts:  No  Memory:  WNL  Judgement:  Good  Insight:  Good  Psychomotor Activity:  Normal  Concentration:  Concentration: Good  Recall:  Good   Fund of Knowledge: Good  Language: Good  Assets:  Desire for Improvement  ADL's:  Intact  Cognition: WNL  Prognosis:  Good    DIAGNOSES:    ICD-10-CM   1. Insomnia, unspecified type  G47.00 hydrOXYzine  (ATARAX ) 25 MG tablet    mirtazapine  (REMERON ) 7.5 MG tablet    2. GAD (generalized anxiety disorder)  F41.1 hydrOXYzine  (ATARAX ) 25 MG tablet    3. Bipolar affective disorder, remission status unspecified (HCC)  F31.9 Iloperidone  (FANAPT ) 2 MG TABS    pregabalin  (LYRICA ) 200 MG capsule    4. Attention deficit hyperactivity disorder (ADHD), combined type  F90.2 amphetamine -dextroamphetamine (ADDERALL  XR) 25 MG 24 hr capsule    5. PTSD (post-traumatic stress disorder)  F43.10 pregabalin  (LYRICA ) 200 MG capsule      Receiving Psychotherapy: No    RECOMMENDATIONS:  Greater than 50% of  30 min face to face time with patient was spent on counseling and coordination of care. Discussed her current stability since last visit. Discussed her good stability and happiness with medication today. She understands that her current pain levels are complicating treatment. She is following up with Neuro surgical promptly.  Discussed potential benefits, risks, and side effects of Fanapt  for mood stabilization, anxiety, and insomnia. Reviewed potential adverse effects with atypical antipsychotics. Discussed that Fanapt  has less risk of metabolic side effects compared to many other atypical antipsychotics.  We agreed: Continue  to  Fanapt  2 mg at bedtime  daily We discussed her report of poor sleep.  Continue Hydroxyzine  25 mg at bedtime for insomnia and anxiety.  Continue Ambien  10 mg 1/2-1 tablet at bedtime for insomnia. Will start Mirtazapine  7.5-15 mg at bedtime daily for sleep. Increase  Adderall  XR 25 mg daily for ADHD.  Patient advised to contact office with any questions, adverse effects, or acute worsening in signs and symptoms. To follow up in 3 months per pt Provided emergency contact  information Reviewed PDMP             Julie DELENA Pizza, NP

## 2024-07-29 NOTE — Patient Instructions (Signed)
 It was so nice to see you today. Thank you so much for coming in.    I want to get an MRI of your lower back to look into things further. We will get this approved through your insurance and [facility] will call you to schedule the appointment. Ask about your patient responsibility. You do not need to pay this prior to getting MRI, they can bill you.   When you get the MRI done, remind them to do xrays of your neck.   [facility] (building with the white pillars) is located off of [address]. The address is [address], [place], [place] [address].    After you have the MRI and xrays, it can take 14-28 days for me to get the results back. If I don't have them in 2 weeks, we will call to try to get the results.   Once I have the results, we will call you to schedule a follow up visit with me to review them.   I want to get an EMG (nerve conduction test) to look into things further. I have ordered this and [facility] will call you to schedule. You can also call them at [phone].   Please do not hesitate to call if you have any questions or concerns. You can also message me in MyChart.   Glade Boys PA-C [phone]     The physicians and staff at [facility] Neurosurgery at [facility] are committed to providing excellent care. You may receive a survey asking for feedback about your experience at our office. We value you your feedback and appreciate you taking the time to to fill it out. The [facility] leadership team is also available to discuss your experience in person, feel free to contact us  [phone].

## 2024-08-06 ENCOUNTER — Other Ambulatory Visit: Payer: Self-pay | Admitting: Behavioral Health

## 2024-08-06 DIAGNOSIS — G4700 Insomnia, unspecified: Secondary | ICD-10-CM

## 2024-08-16 ENCOUNTER — Other Ambulatory Visit: Payer: Self-pay | Admitting: Internal Medicine

## 2024-08-16 DIAGNOSIS — K219 Gastro-esophageal reflux disease without esophagitis: Secondary | ICD-10-CM

## 2024-08-17 NOTE — Telephone Encounter (Signed)
 Requested Prescriptions  Pending Prescriptions Disp Refills   pantoprazole  (PROTONIX ) 40 MG tablet [Pharmacy Med Name: PANTOPRAZOLE  SOD DR 40 MG TAB] 30 tablet 2    Sig: TAKE 1 TABLET BY MOUTH EVERY DAY     Gastroenterology: Proton Pump Inhibitors Passed - 08/17/2024 12:18 PM      Passed - Valid encounter within last 12 months    Recent Outpatient Visits           1 month ago Type 2 diabetes mellitus with hyperglycemia, with long-term current use of insulin  [facility])   [facility] Bernardo Fend, DO   3 months ago Type 2 diabetes mellitus with hyperglycemia, with long-term current use of insulin  [facility])   [facility] Bernardo Fend, DO   4 months ago Acute pancreatitis without infection or necrosis, unspecified pancreatitis type   [facility] Bernardo Fend, DO   4 months ago Acute pancreatitis without infection or necrosis, unspecified pancreatitis type   [facility] Bernardo Fend, DO   6 months ago Type 2 diabetes mellitus with hyperglycemia, with long-term current use of insulin  [facility])   [facility] Bernardo Fend, DO       Future Appointments             In 1 month Bernardo Fend, DO [facility], [place]

## 2024-08-20 ENCOUNTER — Ambulatory Visit: Admission: RE | Admit: 2024-08-20 | Source: Ambulatory Visit

## 2024-08-23 ENCOUNTER — Ambulatory Visit
Admission: RE | Admit: 2024-08-23 | Discharge: 2024-08-23 | Disposition: A | Discharge status: Home / Self Care | Source: Ambulatory Visit | Attending: Orthopedic Surgery | Admitting: Orthopedic Surgery

## 2024-08-23 ENCOUNTER — Encounter: Payer: Self-pay | Admitting: Radiology

## 2024-08-23 DIAGNOSIS — Z981 Arthrodesis status: Secondary | ICD-10-CM

## 2024-08-23 DIAGNOSIS — M47816 Spondylosis without myelopathy or radiculopathy, lumbar region: Secondary | ICD-10-CM | POA: Diagnosis present

## 2024-08-23 DIAGNOSIS — M5416 Radiculopathy, lumbar region: Secondary | ICD-10-CM | POA: Diagnosis present

## 2024-08-23 DIAGNOSIS — R202 Paresthesia of skin: Secondary | ICD-10-CM | POA: Insufficient documentation

## 2024-08-23 DIAGNOSIS — R200 Anesthesia of skin: Secondary | ICD-10-CM | POA: Insufficient documentation

## 2024-08-24 ENCOUNTER — Other Ambulatory Visit: Payer: Self-pay

## 2024-08-24 ENCOUNTER — Encounter: Payer: Self-pay | Admitting: Neurology

## 2024-08-24 DIAGNOSIS — R202 Paresthesia of skin: Secondary | ICD-10-CM

## 2024-08-30 ENCOUNTER — Other Ambulatory Visit: Payer: Self-pay | Admitting: Behavioral Health

## 2024-08-30 DIAGNOSIS — G4700 Insomnia, unspecified: Secondary | ICD-10-CM

## 2024-09-02 NOTE — Progress Notes (Unsigned)
 Telephone Visit- Progress Note: Referring Physician:  Bernardo Fend, DO [address] [place],  [place] [address]  Primary Physician:  Bernardo Fend, DO  This visit was performed via telephone.  Patient location: home Provider location: working from home  I spent a total of 20 minutes non-face-to-face activities for this visit on the date of this encounter including review of current clinical condition and response to treatment.    Patient has given verbal consent to this telephone visits and we reviewed the limitations of a telephone visit. Patient wishes to proceed.    Chief Complaint:  review imaging  History of Present Illness: Julie Abbott is a 48 y.o. female has a history of COPD, DM, IC, PTSD, ADHD, bipolar, PCOS.   She had cervical fusion in 2015. She's had pain since that time.   Last seen by me on 07/29/24 for constant neck pain with intermittent numbness in her arms.   Xrays from 2022 show possible pseudoarthrosis C5-C6 with screws at C5 directed toward C5-C6 disc space   She also had constant LBP with bilateral posterior leg pain to her knee x years. No numbness, tingling, or weakness in her legs.    MRI from 07/06/21 showed disc at L5-S1. EMG 07/17/21 showed possibility of left lower extremity lumbar radiculopathy changes with particular attention to L5-S1 level.   Phone visit scheduled to review lumbar MRI and cervical xrays/CT scan. EMG of upper extremities scheduled for 10/18/24.   Neck pain = LBP in general.   She is about the same. She has constant pain in her neck since her surgery. She has intermittent numbness in her arms- they feel numb and weak from elbow to hand at times. She has intermittent muscle spasms in her arms. Arms sometimes feel heavy.    She has also constant LBP with bilateral posterior leg pain to her knee. No numbness, tingling, or weakness in her legs. Pain is worse with bending, prolonged  standing/walking/sitting.    She smokes 5 cigarettes per day x 35 years.    Bowel/Bladder Dysfunction: she has urinary urgency x years and bowel urgency x 1 year.    Conservative measures:  Physical therapy: none recently Multimodal medical therapy including regular antiinflammatories:  lyrica  Injections: Left L5-S1 TF  11/17/21   Past Surgery:  Cervical Fusion in 2015 by Dr. Cleatus in [place], [place]  Exam: No exam done as this was a telephone encounter.     Imaging: Cervical xrays dated 08/23/24:  FINDINGS: There is chronic straightening of the normal cervical lordosis with trace anterolisthesis of C3 on C4 and trace retrolisthesis C6 on C7, similar to the prior radiographs. No acute fracture is identified. Sequelae of C5-C7 ACDF are again identified with unchanged appearance of the fusion plate and screws, including inferior angulation of the C5 screw with tip projecting over the C5-6 disc space. Arthrodesis status is more fully evaluated on the contemporaneous CT. Anterior spurring at C4 is similar to the prior radiographs. The prevertebral soft tissues are within normal limits. The included lung apices are clear.   IMPRESSION: Unchanged appearance of C5-C7 ACDF.  No acute osseous abnormality.     Electronically Signed   By: Dasie Hamburg M.D.   On: 08/26/2024 14:10   Cervical CT scan dated 08/23/24:  FINDINGS: Alignment: Straightening/slight reversal of the normal cervical lordosis. Trace anterolisthesis of C3 on C4. Trace retrolisthesis of C6 on C7.   Skull base and vertebrae: No acute fracture or suspicious lesion. Previous C5-C7 ACDF.  The C5 screw is chronically angled inferiorly, terminating in the C5-6 disc space. There is mild lucency along the C5 screw, and there is endplate sclerosis at C5-6 without evidence of interbody osseous fusion. There is a small amount of bridging bone across the central portion of the C6-7 disc space.   Soft tissues and  spinal canal: No prevertebral fluid or swelling. No visible canal hematoma.   Disc levels:   C2-3: Negative.   C3-4: Mild disc bulging without evidence of significant stenosis.   C4-5: Mild disc space narrowing and prominent anterior vertebral spurring. Disc bulging, a small far left paracentral disc protrusion, and mild uncovertebral spurring result in mild spinal stenosis without evidence of significant neural foraminal stenosis. Findings have likely slightly progressed from the prior MRI.   C5-6: ACDF. Endplate spurring most notable in the left paracentral region results in mild residual spinal stenosis and mild left neural foraminal stenosis, similar to the prior MRI.   C6-7: ACDF. Endplate spurring most notable in the left central region results in mild residual spinal stenosis and mild-to-moderate left neural foraminal stenosis, similar to the prior MRI.   C7-T1: Minor spondylosis and facet arthrosis without evidence of significant stenosis.   Upper chest: Clear lung apices.   Other: None.   IMPRESSION: 1. C5-C7 ACDF. Mild lucency along the C5 screw which is chronically angulated and terminates in the C5-6 disc space without interbody osseous fusion at C5-6. Small amount bridging bone centrally at C6-7. 2. Mild residual spinal stenosis and left neural foraminal stenosis at C5-6 and C6-7. 3. Mild spinal stenosis at C4-5.     Electronically Signed   By: Dasie Hamburg M.D.   On: 08/26/2024 14:07    Lumbar MRI dated 08/23/24:  FINDINGS: There is stable alignment of the lumbar spine. Mild disc desiccation and mild facet arthrosis is present. There is no vertebral body height loss, subluxation or marrow replacing process. The sacrum and SI joints are unremarkable so far as visualized. Conus and cauda equina are unremarkable.   T12-L1: There is no focal disc protrusion, foraminal or spinal stenosis.   L1-2: There is no focal disc protrusion, foraminal or spinal  stenosis.   L2-3: There is no focal disc protrusion, foraminal or spinal stenosis.   L3-4: There is a minimal broad-based bulge slightly effacing the ventral thecal sac. No significant foraminal or spinal stenosis.   L4-5: Mild broad-based bulge and mild facet arthrosis. No significant foraminal or spinal stenosis.   L5-S1: Mild broad-based bulge with caudal foraminal narrowing, left greater than right. Correlation for mild L5 radiculopathy, left greater than right. Moderate facet arthrosis is identified.   The retroperitoneal structures demonstrate no significant abnormality.   IMPRESSION: Stable to slightly retracted left broad-based bulge with a left paracentral/foraminal component. There is mild caudal foraminal narrowing, left greater than right. Correlation for mild L5 radiculopathy.   Otherwise, no significant abnormality.   Electronically signed by: Norleen Satchel MD 08/24/2024 05:18 PM EDT RP Workstation: [id number]    I have personally reviewed the images and agree with the above interpretation.  Above cervical CT and xrays reviewed with Dr. Clois prior to her visit.   Assessment and Plan: Ms. Jha had cervical fusion in 2015. She's had pain since that time.    She has constant LBP with bilateral posterior leg pain to her knee x years. No numbness, tingling, or weakness in her legs.    She has known lumbar spondylosis with left paracentral disc at L5-S1 with mild right  and moderate left foraminal stenosis. EMG 07/17/21 showed possibility of left lower extremity lumbar radiculopathy changes with particular attention to L5-S1 level.    She has constant pain in her neck since her surgery. She has intermittent numbness in her arms- they feel numb and weak from elbow to hand at times.    She has known pseudoarthrosis C5-C6 with screws at C5 directed toward C5-C6 disc space. She appears to be healed at C6-C7. She has some adjacent level spondylosis and mild central  stenosis C4-C5.    Treatment options reviewed with patient and following plan made:   - Referral to [facility] for cervical and lumbar spine. She will call them.  - Referral to pain management (Lateef) to consider lumbar injections.  - Will cancel EMG- I don't think we need it after reviewing her cervical imaging.  - Oblique xrays were done instead of flex/ext views. Will have these done at no charge and message her with results.  - If no improvement with PT for cervical spine, will have her follow up with one of the surgeons.  - Follow up with me in 6-8 weeks and prn.   Glade Boys PA-C Neurosurgery

## 2024-09-03 ENCOUNTER — Encounter: Payer: Self-pay | Admitting: Orthopedic Surgery

## 2024-09-03 ENCOUNTER — Telehealth: Payer: Self-pay | Admitting: Orthopedic Surgery

## 2024-09-03 ENCOUNTER — Ambulatory Visit (INDEPENDENT_AMBULATORY_CARE_PROVIDER_SITE_OTHER): Admitting: Orthopedic Surgery

## 2024-09-03 DIAGNOSIS — M4722 Other spondylosis with radiculopathy, cervical region: Secondary | ICD-10-CM

## 2024-09-03 DIAGNOSIS — Z981 Arthrodesis status: Secondary | ICD-10-CM

## 2024-09-03 DIAGNOSIS — M960 Pseudarthrosis after fusion or arthrodesis: Secondary | ICD-10-CM | POA: Diagnosis not present

## 2024-09-03 DIAGNOSIS — M5416 Radiculopathy, lumbar region: Secondary | ICD-10-CM

## 2024-09-03 DIAGNOSIS — M5412 Radiculopathy, cervical region: Secondary | ICD-10-CM

## 2024-09-03 DIAGNOSIS — M48061 Spinal stenosis, lumbar region without neurogenic claudication: Secondary | ICD-10-CM | POA: Diagnosis not present

## 2024-09-03 DIAGNOSIS — M47816 Spondylosis without myelopathy or radiculopathy, lumbar region: Secondary | ICD-10-CM

## 2024-09-03 DIAGNOSIS — S129XXA Fracture of neck, unspecified, initial encounter: Secondary | ICD-10-CM

## 2024-09-03 DIAGNOSIS — M4802 Spinal stenosis, cervical region: Secondary | ICD-10-CM

## 2024-09-03 DIAGNOSIS — M4726 Other spondylosis with radiculopathy, lumbar region: Secondary | ICD-10-CM

## 2024-09-03 NOTE — Telephone Encounter (Signed)
 She has EMG scheduled with Dr. Leigh on 11/17. She does not need to get this done.   Can you please call them and cancel it?   She is aware.   Thanks!

## 2024-10-04 NOTE — Telephone Encounter (Signed)
 Julie Abbott 10/04/2024 04:23 PM EST lvm ------------------------------------ Julie Abbott 09/30/2024 02:10 PM EDT lvm ------------------------------------ Julie Abbott 09/23/2024 11:51 AM EDT lvm

## 2024-10-05 ENCOUNTER — Ambulatory Visit: Admitting: Internal Medicine

## 2024-10-18 ENCOUNTER — Encounter: Payer: Self-pay | Admitting: Internal Medicine

## 2024-10-18 ENCOUNTER — Ambulatory Visit: Admitting: Internal Medicine

## 2024-10-18 ENCOUNTER — Encounter: Admitting: Neurology

## 2024-10-18 ENCOUNTER — Other Ambulatory Visit: Payer: Self-pay

## 2024-10-18 VITALS — BP 130/78 | HR 100 | Temp 98.2°F | Resp 16 | Ht 65.0 in | Wt 170.5 lb

## 2024-10-18 DIAGNOSIS — Z794 Long term (current) use of insulin: Secondary | ICD-10-CM

## 2024-10-18 DIAGNOSIS — Z23 Encounter for immunization: Secondary | ICD-10-CM

## 2024-10-18 DIAGNOSIS — R809 Proteinuria, unspecified: Secondary | ICD-10-CM | POA: Diagnosis not present

## 2024-10-18 DIAGNOSIS — K219 Gastro-esophageal reflux disease without esophagitis: Secondary | ICD-10-CM

## 2024-10-18 DIAGNOSIS — E1129 Type 2 diabetes mellitus with other diabetic kidney complication: Secondary | ICD-10-CM | POA: Diagnosis not present

## 2024-10-18 DIAGNOSIS — Z1231 Encounter for screening mammogram for malignant neoplasm of breast: Secondary | ICD-10-CM

## 2024-10-18 LAB — POCT GLYCOSYLATED HEMOGLOBIN (HGB A1C): Hemoglobin A1C: 11.2 % — AB (ref 4.0–5.6)

## 2024-10-18 MED ORDER — FREESTYLE LIBRE 3 PLUS SENSOR MISC: 3 refills | Status: AC

## 2024-10-18 MED ORDER — PANTOPRAZOLE SODIUM 40 MG PO TBEC: 40.0000 mg | DELAYED_RELEASE_TABLET | Freq: Every day | ORAL | 2 refills | Status: AC

## 2024-10-18 MED ORDER — LOSARTAN POTASSIUM 25 MG PO TABS: 25.0000 mg | ORAL_TABLET | Freq: Every day | ORAL | 1 refills | Status: AC

## 2024-10-18 MED ORDER — TOUJEO MAX SOLOSTAR 300 UNIT/ML ~~LOC~~ SOPN: 40.0000 [IU] | PEN_INJECTOR | Freq: Every day | SUBCUTANEOUS | 4 refills | Status: AC

## 2024-10-18 MED ORDER — INSULIN LISPRO (1 UNIT DIAL) 100 UNIT/ML (KWIKPEN): 10.0000 [IU] | PEN_INJECTOR | Freq: Three times a day (TID) | SUBCUTANEOUS | 3 refills | Status: AC

## 2024-10-18 NOTE — Progress Notes (Signed)
 Established Patient Office Visit  Subjective   Patient ID: Julie Abbott, female    DOB: 1976/06/29  Age: 48 y.o. MRN: [id number]  Chief Complaint  Patient presents with   Medical Management of Chronic Issues    3 month recheck    Back Pain Pertinent negatives include no abdominal pain.    Patient is here for follow up on diabetes after recent episode of pancreatitis in April.   Discussed the use of AI scribe software for clinical note transcription with the patient, who gave verbal consent to proceed.  History of Present Illness  Julie Abbott is a 48 year old female with diabetes who presents for management of her blood sugar levels.  Her A1c has decreased from 13 to 11.2 but remains elevated. She is on Toujeo  32 units and Humalog  10 units at meals. Irregular eating due to a busy schedule affects her ability to take Humalog  consistently. On days without meals, her blood sugar drops to 150, and it can rise to 400 after a small meal. She struggles to maintain a regular eating schedule and often prioritizes sleep over meals.  She cannot tolerate several diabetes medications, including GLP-1s, metformin, sulfonylureas, and Jardiance. She has been on metformin for 23 years without significant benefit. Her current medications include Toujeo , Humalog , Protonix , losartan , and an albuterol  inhaler. She does not take Wellbutrin  despite it being prescribed.  She experiences muscle cramps in her calves and feet, not due to low potassium. She stays hydrated and considers electrolyte supplements. Occasional chest tightness occurs, and she uses an albuterol  inhaler as needed. She smokes and has a busy schedule visiting multiple families daily.   Diabetes, Type 2: -Last A1c 13.0 8/25 -Diagnosed with type 2 diabetes when she was 48 years old after being diagnosed with PCOS as well  -Medications: now on Toujeo  32 units at bedtime, Humalog  10 units before meals but not eating or taking regularly   -Had been on Metformin for 15 years but had GI side effects and wasn't working well, had also been on sulfonylureas which didn't work, has been on insulin  since 48 years old. Did well with Victoza in terms of sugars but was lightheaded, Ozempic caused pancreatitis last year. Jardiance cause vaginal infections   -Patient is compliant with the above medications and reports no side effects.  -Checking BG at home: has freestyle libre 3 - fasting sugar around 150 but will go over 400 regularly after eating -Diet: tries to stay well hydrated, drinking juice, trying to eat protein -Eye exam: UTD -Foot exam: UTD -Microalbumin: UTD -Statin: no -PNA vaccine: UTD -Denies symptoms of hypoglycemia, polyuria, polydipsia, foot ulcers/trauma.   Bipolar/PTSD/ADHD: -Following with Psychiatry  -Currently on Iloperidone  1 mg, Adderall  20 mg XL once daily hydroxyzine  as needed and 10 mg, Lyrica  200 mg TID   Review of Systems  Gastrointestinal:  Negative for abdominal pain, nausea and vomiting.  Musculoskeletal:  Positive for back pain and neck pain.      Objective:     BP 130/78 (Cuff Size: Normal)   Pulse 100   Temp 98.2 F (36.8 C) (Oral)   Resp 16   Ht 5' 5 (1.651 m)   Wt 170 lb 8 oz (77.3 kg)   LMP  (LMP Unknown)   SpO2 98%   BMI 28.37 kg/m  BP Readings from Last 3 Encounters:  10/18/24 130/78  07/02/24 110/68  05/04/24 120/80   Wt Readings from Last 3 Encounters:  10/18/24 170 lb 8 oz (77.3 kg)  07/29/24 174 lb (78.9 kg)  07/02/24 170 lb (77.1 kg)      Physical Exam Constitutional:      Appearance: Normal appearance.  HENT:     Head: Normocephalic and atraumatic.  Eyes:     Conjunctiva/sclera: Conjunctivae normal.  Cardiovascular:     Rate and Rhythm: Normal rate and regular rhythm.  Pulmonary:     Effort: Pulmonary effort is normal.     Breath sounds: Normal breath sounds.  Skin:    General: Skin is warm and dry.  Neurological:     General: No focal deficit present.      Mental Status: She is alert. Mental status is at baseline.  Psychiatric:        Mood and Affect: Mood normal.        Behavior: Behavior normal.      Results for orders placed or performed in visit on 10/18/24  POCT HgB A1C  Result Value Ref Range   Hemoglobin A1C 11.2 (A) 4.0 - 5.6 %   HbA1c POC (<> result, manual entry)     HbA1c, POC (prediabetic range)     HbA1c, POC (controlled diabetic range)       Last CBC Lab Results  Component Value Date   WBC 6.6 12/02/2023   HGB 14.9 12/02/2023   HCT 46.9 (H) 12/02/2023   MCV 97 12/02/2023   MCH 30.8 12/02/2023   RDW 12.1 12/02/2023   PLT 204 12/02/2023   Last metabolic panel Lab Results  Component Value Date   GLUCOSE 645 (HH) 04/05/2024   NA 128 (L) 04/05/2024   K 4.9 04/05/2024   CL 91 (L) 04/05/2024   CO2 27 04/05/2024   BUN 9 04/05/2024   CREATININE 0.85 04/05/2024   EGFR 84 04/05/2024   CALCIUM 9.9 04/05/2024   PROT 7.1 12/02/2023   ALBUMIN 4.6 12/02/2023   LABGLOB 2.5 12/02/2023   BILITOT 0.6 12/02/2023   ALKPHOS 99 12/02/2023   AST 93 (H) 12/02/2023   ALT 91 (H) 12/02/2023   Last lipids No results found for: CHOL, HDL, LDLCALC, LDLDIRECT, TRIG, CHOLHDL Last hemoglobin A1c Lab Results  Component Value Date   HGBA1C 11.2 (A) 10/18/2024   Last thyroid functions Lab Results  Component Value Date   TSH 4.440 12/02/2023   Last vitamin D No results found for: 25OHVITD2, 25OHVITD3, VD25OH Last vitamin B12 and Folate No results found for: VITAMINB12, FOLATE    The ASCVD Risk score (Arnett DK, et al., 2019) failed to calculate for the following reasons:   Risk score cannot be calculated because patient has a medical history suggesting prior/existing ASCVD    Assessment & Plan:   Assessment & Plan  Type 2 diabetes mellitus with hyperglycemia A1c improved from 13 to 11.2 but remains elevated. Insulin  regimen includes Toujeo  and Humalog . Basal insulin  adjustment needed.  Mealtime insulin  should be used with sugary beverages. Consideration of protein shakes for nutritional intake and glycemic control. Referred to pharmacist for medication review. - Increased Toujeo  to 40 units daily. - Continue Humalog  at 10 units with meals and sugary beverages. - Consider protein shakes for better nutritional intake and glycemic control. - Referred to pharmacist for medication review.  Gastroesophageal reflux disease Protonix  taken daily for GERD management. - Refilled Protonix  prescription.  Muscle cramps Experiencing muscle cramps in calves and feet. Potassium levels normal. Discussed hydration, stretching, and vitamins. - Ensure adequate hydration. - Consider multivitamins or increased dietary potassium. - Consider electrolyte solutions like zero sugar Gatorade or liquid  IV.  General Health Maintenance Mammogram ordered. Pneumonia vaccine up to date. Flu vaccine received. Discussed pneumonia vaccine schedule and importance due to diabetes and emphysema. - Scheduled [facility] at [facility]. - Continue pneumonia vaccine every five years.  - POCT HgB A1C - insulin  glargine, 2 Unit Dial , (TOUJEO  MAX SOLOSTAR) 300 UNIT/ML Solostar Pen; Inject 40 Units into the skin daily.  Dispense: 3.2 mL; Refill: 4 - insulin  lispro (HUMALOG  KWIKPEN) 100 UNIT/ML KwikPen; Inject 10 Units into the skin with breakfast, with lunch, and with evening meal.  Dispense: 9 mL; Refill: 3 - losartan  (COZAAR ) 25 MG tablet; Take 1 tablet (25 mg total) by mouth daily.  Dispense: 90 tablet; Refill: 1 - Continuous Glucose Sensor (FREESTYLE LIBRE 3 PLUS SENSOR) MISC; Change sensor every 15 days.  Dispense: 2 each; Refill: 3 - AMB Referral VBCI Care Management - Flu vaccine trivalent PF, 6mos and older(Flulaval,Afluria,Fluarix,Fluzone) - pantoprazole  (PROTONIX ) 40 MG tablet; Take 1 tablet (40 mg total) by mouth daily.  Dispense: 30 tablet; Refill: 2 - MM 3D SCREENING MAMMOGRAM BILATERAL BREAST;  Future   Return in about 3 months (around 01/18/2025) for follow up on a1c.    Sharyle Fischer, DO

## 2024-10-25 ENCOUNTER — Telehealth: Payer: Self-pay

## 2024-10-25 NOTE — Progress Notes (Signed)
 Complex Care Management Note Care Guide Note  10/25/2024 Name: Julie Abbott MRN: [id number] DOB: 1976/10/19   Complex Care Management Outreach Attempts: An unsuccessful telephone outreach was attempted today to offer the patient information about available complex care management services.  Follow Up Plan:  Additional outreach attempts will be made to offer the patient complex care management information and services.   Encounter Outcome:  No Answer  Dreama [facility]  [facility], [facility] VBCI Assistant Direct Dial : [phone]  Fax: [phone]

## 2024-10-26 ENCOUNTER — Telehealth: Payer: Self-pay

## 2024-10-26 DIAGNOSIS — M5412 Radiculopathy, cervical region: Secondary | ICD-10-CM

## 2024-10-26 NOTE — Telephone Encounter (Signed)
 Patient has a follow up with Stacy on 11/04/24-patient has not been scheduled with Dr Marcelino (do not see any notes on attempts to call patient to schedule from their office), do not see any notes about her [facility] notes- I called [facility] and left a message asking to send us  notes if they had initial and discharge notes (not sure patient went to PT).  Patient also needs needs cervical flex/ext xrays done at no charge per [facility].  If patient has not done PT need to push her follow up back. Patient can get xray done at anytime now or wait until her follow up.  Called and left a message for the patient. I do not see that patient has been viewing her mychart messages from us .

## 2024-10-26 NOTE — Progress Notes (Signed)
 Referring Physician:  Bernardo Fend, DO [address] [place],  [place] [address]  Primary Physician:  Bernardo Fend, DO  History of Present Illness: [name] is here today with a chief complaint of COPD, DM, IC, PTSD, ADHD, bipolar, PCOS.   Had cervical fusion in 2015. She's had pain since that time.   Did phone visit with me on 09/03/24 to review her imaging.   She has known lumbar spondylosis with left paracentral disc at L5-S1 with mild right and moderate left foraminal stenosis. EMG 07/17/21 showed possibility of left lower extremity lumbar radiculopathy changes with particular attention to L5-S1 level.   She has known pseudoarthrosis C5-C6 with screws at C5 directed toward C5-C6 disc space. She appears to be healed at C6-C7. She has some adjacent level spondylosis and mild central stenosis C4-C5.   She was sent to PT for cervical and lumbar spine- did not go. She was referred to pain management to consider lumbar injections and has not see him.   She is here for follow up. Neck pain < LBP in general.    She is about the same. She has constant pain in her neck with pain in both arms to her hands. She has intermittent numbness in her arms- they feel numb and weak from elbow to hand at times. She has muscle spasms in her arms. She has radiation of pain into her shoulder blades as well.    She has 2 weeks of increased pain left hip/buttock pain. No groin pain. She still has constant LBP with bilateral posterior leg pain to her knee. No numbness, tingling, or weakness in her legs. Pain is worse with bending, prolonged standing/walking/sitting.   She smokes < 5 cigarettes per day x 35 years.   Bowel/Bladder Dysfunction: she has chronic urinary/bowel urgency x years   Conservative measures:  Physical therapy: none recently Multimodal medical therapy including regular antiinflammatories:  lyrica  Injections: Left L5-S1 TF  11/17/21  Past Surgery:   Cervical Fusion in 2015 by Dr. Cleatus in [place], [place]  [name] has no symptoms of cervical myelopathy.  The symptoms are causing a significant impact on the patient's life.   Review of Systems:  A 10 point review of systems is negative, except for the pertinent positives and negatives detailed in the HPI.  Past Medical History: Past Medical History:  Diagnosis Date   Back pain    Bipolar affective disorder (HCC) 07/18/2021   Cervical spondylosis with myelopathy and radiculopathy 11/10/2021   Chronic bilateral low back pain 07/18/2021   Constipation by outlet dysfunction 04/06/2021   Last Assessment & Plan:    CHRONIC CONSTIPATION WITH RECENT ONSET OF VOMITING   ?? Blockage/ unknown etiology     LAB RESULTS FROM 04/03/21  -- WBC 5.3  Monitor diet, avoid heavy meals, recommend soft foods, liquid diet until eval by GI  Upcoming GI appt pendig  Advised if develop pain, fever, increase/ worsening symptoms, go to ER     Diabetes mellitus, type II (HCC)    Diabetic polyneuropathy associated with type 2 diabetes mellitus (HCC) 04/06/2021   H/O total hysterectomy 12/10/2021   Hirsutism 04/11/1995   History of fusion of cervical spine 04/10/2022   Interstitial cystitis    Mixed hyperlipidemia 07/18/2021   Other insomnia 07/18/2021   PCOS (polycystic ovarian syndrome)    PTSD (post-traumatic stress disorder) 07/18/2021   Type 2 diabetes mellitus with complication, with long-term current use of insulin  (HCC) 07/18/2021  Past Surgical History: Past Surgical History:  Procedure Laterality Date   ABDOMINAL HYSTERECTOMY     SPINAL FUSION      Allergies: Allergies as of 11/04/2024 - Review Complete 11/04/2024  Allergen Reaction Noted   Bee venom  03/13/2020   Gabapentin  03/13/2020   Lisinopril Cough 12/17/2022   Lortab [hydrocodone-acetaminophen]  03/13/2020   Ozempic (0.25 or 0.5 mg-dose) [semaglutide(0.25 or 0.5mg -dos)] Other (See Comments) 12/02/2023     Medications: Outpatient Encounter Medications as of 11/04/2024  Medication Sig   albuterol  (VENTOLIN  HFA) 108 (90 Base) MCG/ACT inhaler Inhale 2 puffs into the lungs every 6 (six) hours as needed for wheezing or shortness of breath.   amphetamine -dextroamphetamine (ADDERALL  XR) 25 MG 24 hr capsule Take 1 capsule by mouth every morning.   amphetamine -dextroamphetamine (ADDERALL  XR) 25 MG 24 hr capsule Take 1 capsule by mouth every morning.   Continuous Glucose Sensor (FREESTYLE LIBRE 3 PLUS SENSOR) MISC Change sensor every 15 days.   EPINEPHrine  0.3 mg/0.3 mL IJ SOAJ injection Inject 0.3 mg into the muscle as needed for anaphylaxis.   hydrOXYzine  (ATARAX ) 25 MG tablet TAKE 1 TABLET BY MOUTH EVERYDAY AT BEDTIME   Iloperidone  (FANAPT ) 1 MG TABS Take 1 tablet at bedtime for 4 nights, then may increase to 2 tablets at bedtime if needed/tolerated   Iloperidone  (FANAPT ) 1 MG TABS Take 1-2 tablets at bedtime   Iloperidone  (FANAPT ) 2 MG TABS Take 1 tablet (2 mg total) by mouth at bedtime.   insulin  glargine, 2 Unit Dial , (TOUJEO  MAX SOLOSTAR) 300 UNIT/ML Solostar Pen Inject 40 Units into the skin daily.   insulin  lispro (HUMALOG  KWIKPEN) 100 UNIT/ML KwikPen Inject 10 Units into the skin with breakfast, with lunch, and with evening meal.   Insulin  Pen Needle 32G X 6 MM MISC 1 each by Does not apply route daily.   losartan  (COZAAR ) 25 MG tablet Take 1 tablet (25 mg total) by mouth daily.   mirtazapine  (REMERON ) 7.5 MG tablet TAKE 1-2 TABLETS BY MOUTH DAILY AT BEDTIME FOR SLEEP   pantoprazole  (PROTONIX ) 40 MG tablet Take 1 tablet (40 mg total) by mouth daily.   pregabalin  (LYRICA ) 200 MG capsule Take 1 capsule (200 mg total) by mouth 3 (three) times daily.   zolpidem  (AMBIEN ) 10 MG tablet TAKE 1/2-1 TABLET BY MOUTH AT BEDTIME   No facility-administered encounter medications on file as of 11/04/2024.    Social History: Social History   Tobacco Use   Smoking status: Some Days    Current packs/day:  0.25    Types: Cigarettes    Passive exposure: Current   Smokeless tobacco: Never  Vaping Use   Vaping status: Never Used  Substance Use Topics   Alcohol use: Never   Drug use: Never    Family Medical History: Family History  Problem Relation Age of Onset   Bipolar disorder Daughter    Anxiety disorder Daughter    Depression Mother    Alcohol abuse Mother    Drug abuse Mother    OCD Mother    Panic disorder Mother    Alcohol abuse Maternal Aunt    Polycystic ovary syndrome Paternal Aunt    Alcohol abuse Maternal Uncle    Alcohol abuse Maternal Grandmother    Anxiety disorder Maternal Grandmother    Depression Maternal Grandmother    Polycystic ovary syndrome Paternal Grandmother     Physical Examination:  Vitals:   11/04/24 0825  BP: 136/86      Awake, alert, oriented to person,  place, and time.  Speech is clear and fluent. Fund of knowledge is appropriate.   Cranial Nerves: Pupils equal round and reactive to light.  Facial tone is symmetric.    Tenderness in bilateral trapezial region into mid scapular region bilaterally.   Left sided lower lumbar tenderness.   No abnormal lesions on exposed skin.   Strength: Side Biceps Triceps Deltoid Interossei Grip Wrist Ext. Wrist Flex.  R 5 5 5 5 5 5 5   L 5 5 5 5 5 5 5    Side Iliopsoas Quads Hamstring PF DF EHL  R 5 5 5 5 5 5   L 5 5 5 5 5 5    Reflexes are 2+ and symmetric at the biceps, brachioradialis, patella. Hoffman's is absent.  Clonus is not present.   Bilateral upper and lower extremity sensation is intact to light touch.     No pain with IR/ER of both hips. No tenderness over greater trochanteric bursa bilaterally.   Gait is slow.    Medical Decision Making  Imaging: Cervical xrays dated 11/04/24:  ACDF C5-C7. Screws at C5 directed toward C5-C6 disc space. Mild cervical spondylosis, no instability.   Report for above xrays not yet available.    Assessment and Plan: Ms. Kohan had cervical  fusion in 2015. She's had pain since that time.   She has constant pain in her neck with pain in both arms to her hands. She has intermittent numbness in her arms- they feel numb and weak from elbow to hand at times. She has muscle spasms in her arms. She has radiation of pain into her shoulder blades as well.   She has known pseudoarthrosis C5-C6 with screws at C5 directed toward C5-C6 disc space. She appears to be healed at C6-C7. She has some adjacent level spondylosis and mild central stenosis C4-C5. No instability on her cervical xrays.    She has 2 weeks of increased pain left hip/buttock pain. No groin pain. She still has constant LBP with bilateral posterior leg pain to her knee. No numbness, tingling, or weakness in her legs.   She has known lumbar spondylosis with left paracentral disc at L5-S1 with mild right and moderate left foraminal stenosis. EMG 07/17/21 showed possibility of left lower extremity lumbar radiculopathy changes with particular attention to L5-S1 level.    Treatment options reviewed with patient and following plan made:   - Recommend PT for cervical and lumbar spine. She will call [facility] today to see what works with her work schedule and let me know where to send orders.  - Referral done previously to pain management (Lateef) to consider lumbar injections. She will contact them to schedule.  - If no improvement with PT for cervical spine, will have her follow up with one of the surgeons.  - Follow up with me in 6-8 weeks and prn.   I spent a total of 25 minutes in both face-to-face and non-face-to-face activities for this visit on the date of this encounter.   Glade Boys PA-C Dept. of Neurosurgery

## 2024-10-27 NOTE — Telephone Encounter (Signed)
 Called patient no answer, called Julie Abbott-patient's husband, advised him of the message below and asked for patient to call us  back or send mychart message so we can reschedule or how patient would like to proceed at this time

## 2024-11-01 NOTE — Addendum Note (Signed)
 Addended by: GIRARD DON GAILS on: 11/01/2024 10:16 AM   Modules accepted: Orders

## 2024-11-01 NOTE — Telephone Encounter (Signed)
 Spoke with patient, patient has not done PT or called Dr Charolotte office. She works 8 -5 Monday through Friday at [employer] office and it is hard for her to go at times PT office had available. She did think PT was only for the neck but I did advise her it was for neck and back. She will call PT back-she will also call [facility] to see if they take her insurance to do PT there as per website they have Saturday availabilities. She will call Dr Marcelino  Patient wanted to keep her follow up with Stacy on 11/04/24 still since it was on her schedule to be off and has the time for it. Orders for neck xrays placed to be done on 11/04/24 and patient is aware.

## 2024-11-02 NOTE — Progress Notes (Signed)
 Complex Care Management Note Care Guide Note  11/02/2024 Name: Julie Abbott MRN: [id number] DOB: 03-Nov-1976   Complex Care Management Outreach Attempts: A second unsuccessful outreach was attempted today to offer the patient with information about available complex care management services.  Follow Up Plan:  Additional outreach attempts will be made to offer the patient complex care management information and services.   Encounter Outcome:  No Answer  Dreama [facility]  [facility], [facility] VBCI Assistant Direct Dial : [phone]  Fax: [phone]

## 2024-11-04 ENCOUNTER — Other Ambulatory Visit

## 2024-11-04 ENCOUNTER — Ambulatory Visit: Admitting: Orthopedic Surgery

## 2024-11-04 ENCOUNTER — Encounter: Payer: Self-pay | Admitting: Orthopedic Surgery

## 2024-11-04 ENCOUNTER — Other Ambulatory Visit: Payer: Self-pay | Admitting: Behavioral Health

## 2024-11-04 VITALS — BP 136/86 | Wt 167.6 lb

## 2024-11-04 DIAGNOSIS — M5412 Radiculopathy, cervical region: Secondary | ICD-10-CM

## 2024-11-04 DIAGNOSIS — M5416 Radiculopathy, lumbar region: Secondary | ICD-10-CM

## 2024-11-04 DIAGNOSIS — G4700 Insomnia, unspecified: Secondary | ICD-10-CM

## 2024-11-04 DIAGNOSIS — Z981 Arthrodesis status: Secondary | ICD-10-CM

## 2024-11-04 DIAGNOSIS — M5127 Other intervertebral disc displacement, lumbosacral region: Secondary | ICD-10-CM

## 2024-11-04 DIAGNOSIS — M4726 Other spondylosis with radiculopathy, lumbar region: Secondary | ICD-10-CM | POA: Diagnosis not present

## 2024-11-04 DIAGNOSIS — M960 Pseudarthrosis after fusion or arthrodesis: Secondary | ICD-10-CM

## 2024-11-04 DIAGNOSIS — M47812 Spondylosis without myelopathy or radiculopathy, cervical region: Secondary | ICD-10-CM

## 2024-11-04 DIAGNOSIS — M48061 Spinal stenosis, lumbar region without neurogenic claudication: Secondary | ICD-10-CM

## 2024-11-04 DIAGNOSIS — M47816 Spondylosis without myelopathy or radiculopathy, lumbar region: Secondary | ICD-10-CM

## 2024-11-04 DIAGNOSIS — M4802 Spinal stenosis, cervical region: Secondary | ICD-10-CM

## 2024-11-04 DIAGNOSIS — S129XXA Fracture of neck, unspecified, initial encounter: Secondary | ICD-10-CM

## 2024-11-04 NOTE — Patient Instructions (Signed)
 It was so nice to see you today. Thank you so much for coming in.    [facility] in [place] should be open 7a-7p Monday through Friday. You can call them at [phone].   Let me know where I need to send PT orders for your neck and back.   Call Dr. Lateef at [phone] to discuss possible lumbar injections.   I will see you back in 6-8 weeks. Please do not hesitate to call if you have any questions or concerns. You can also message me in MyChart.   Glade Boys PA-C [phone]     The physicians and staff at [facility] at [facility] are committed to providing excellent care. You may receive a survey asking for feedback about your experience at our office. We value you your feedback and appreciate you taking the time to to fill it out. [facility] leadership team is also available to discuss your experience in person, feel free to contact us  [phone].

## 2024-11-05 NOTE — Progress Notes (Signed)
 Complex Care Management Note Care Guide Note  11/05/2024 Name: Julie Abbott MRN: [id number] DOB: 12/11/1975   Complex Care Management Outreach Attempts: A third unsuccessful outreach was attempted today to offer the patient with information about available complex care management services.  Follow Up Plan:  No further outreach attempts will be made at this time. We have been unable to contact the patient to offer or enroll patient in complex care management services.  Encounter Outcome:  No Answer  Dreama [facility]wood [facility]  [facility], [facility] VBCI Assistant Direct Dial : [phone]  Fax: [phone]

## 2024-12-07 ENCOUNTER — Telehealth: Payer: Self-pay | Admitting: Behavioral Health

## 2024-12-07 ENCOUNTER — Other Ambulatory Visit: Payer: Self-pay

## 2024-12-07 DIAGNOSIS — F902 Attention-deficit hyperactivity disorder, combined type: Secondary | ICD-10-CM

## 2024-12-07 NOTE — Telephone Encounter (Signed)
 Pt called at 3:53p requesting refill of Adderall  to  [id number] - [place], [place] - [address] AT [facility] [address], [place] [address] Phone: [phone]  Fax: [phone]   She is asking for enough until her appt, 1/22.

## 2024-12-07 NOTE — Telephone Encounter (Signed)
Pended enough to appt.  

## 2024-12-08 MED ORDER — AMPHETAMINE-DEXTROAMPHET ER 25 MG PO CP24: 25.0000 mg | ORAL_CAPSULE | ORAL | 0 refills | Status: DC

## 2024-12-13 ENCOUNTER — Ambulatory Visit (INDEPENDENT_AMBULATORY_CARE_PROVIDER_SITE_OTHER): Admitting: Behavioral Health

## 2024-12-22 ENCOUNTER — Telehealth: Payer: Self-pay

## 2024-12-22 NOTE — Telephone Encounter (Signed)
 Left message for patient to follow up on her upcoming appointment with Stacy 12/28/24, no PT or injections still.  I know patient stated before her work makes it very difficult to get those things done. Left message asking patient if she would like to go ahead and cancel follow up and then reschedule once she completes PT and injection.

## 2024-12-23 ENCOUNTER — Ambulatory Visit: Admitting: Behavioral Health

## 2024-12-23 ENCOUNTER — Encounter: Payer: Self-pay | Admitting: Behavioral Health

## 2024-12-23 DIAGNOSIS — G4700 Insomnia, unspecified: Secondary | ICD-10-CM

## 2024-12-23 DIAGNOSIS — F3160 Bipolar disorder, current episode mixed, unspecified: Secondary | ICD-10-CM

## 2024-12-23 DIAGNOSIS — F411 Generalized anxiety disorder: Secondary | ICD-10-CM

## 2024-12-23 DIAGNOSIS — F902 Attention-deficit hyperactivity disorder, combined type: Secondary | ICD-10-CM

## 2024-12-23 DIAGNOSIS — F4310 Post-traumatic stress disorder, unspecified: Secondary | ICD-10-CM

## 2024-12-23 MED ORDER — FANAPT 2 MG PO TABS: ORAL_TABLET | ORAL | 1 refills | Status: AC

## 2024-12-23 MED ORDER — MIRTAZAPINE 7.5 MG PO TABS: ORAL_TABLET | ORAL | 0 refills | Status: AC

## 2024-12-23 MED ORDER — PREGABALIN 300 MG PO CAPS: 300.0000 mg | ORAL_CAPSULE | Freq: Every day | ORAL | 1 refills | Status: DC

## 2024-12-23 MED ORDER — BUSPIRONE HCL 15 MG PO TABS: 15.0000 mg | ORAL_TABLET | Freq: Two times a day (BID) | ORAL | 1 refills | Status: AC

## 2024-12-23 MED ORDER — HYDROXYZINE HCL 25 MG PO TABS: ORAL_TABLET | ORAL | 1 refills | Status: AC

## 2024-12-23 MED ORDER — AMPHETAMINE-DEXTROAMPHET ER 25 MG PO CP24: 25.0000 mg | ORAL_CAPSULE | ORAL | 0 refills | Status: AC

## 2024-12-23 MED ORDER — ZOLPIDEM TARTRATE 10 MG PO TABS: ORAL_TABLET | ORAL | 5 refills | Status: AC

## 2024-12-23 NOTE — Progress Notes (Addendum)
 "     [facility]  Patient ID: Julie Abbott,  MRN: [id number]  PCP: Julie Fend, DO  Date of Evaluation: 12/23/2024 Time spent:40 minutes   Virtual Visit via Telephone Note  I connected with pt on 12/23/24 at  8:30 AM EST by telephone and verified that I am speaking with the correct person using two identifiers.   I discussed the limitations, risks, security and privacy concerns of performing an evaluation and management service by telephone and the availability of in person appointments. I also discussed with the patient that there may be a patient responsible charge related to this service. The patient expressed understanding and agreed to proceed.   I discussed the assessment and treatment plan with the patient. The patient was provided an opportunity to ask questions and all were answered. The patient agreed with the plan and demonstrated an understanding of the instructions.   The patient was advised to call back or seek an in-person evaluation if the symptoms worsen or if the condition fails to improve as anticipated.  I provided 40 minutes of non-face-to-face time during this encounter.  The patient was located at home.  The provider was located at [facility].   Julie DELENA Pizza, NP      Chief Complaint:  Chief Complaint   Depression; Anxiety; Follow-up; Medication Refill; Patient Education     HISTORY/CURRENT STATUS: HPI Julie Abbott presents to the office today via telephonic visit for follow-up of anxiety, Bipolar disorder, ADHD,  Patients home mood is somewhat distressed.  Says, I have just not really been doing that well lately.  Says that she is not sure if she needs an increase of her Adderall  or possibly her medication for moods.  Says that typically her anxiety increases if she is having problems with attention and focus.  Feeling more flat and depressed.  She is following up with specialist.   She is open today for medication  adjustment.  Continue to work for [employer] which is high stress job.  Will follow up after the holiday to reassess after she can address problems with pain.      Individual Medical History/ Review of Systems: Changes? :No   Allergies: Bee venom, Gabapentin, Lisinopril, Lortab [hydrocodone-acetaminophen], and Ozempic (0.25 or 0.5 mg-dose) [semaglutide(0.25 or 0.5mg -dos)]  Current Medications: Current Medications[1] Medication Side Effects: none  Family Medical/ Social History: Changes? No   DIAGNOSES:    ICD-10-CM   1. Attention deficit hyperactivity disorder (ADHD), combined type  F90.2 amphetamine -dextroamphetamine (ADDERALL  XR) 25 MG 24 hr capsule    2. Insomnia, unspecified type  G47.00 zolpidem  (AMBIEN ) 10 MG tablet    hydrOXYzine  (ATARAX ) 25 MG tablet    mirtazapine  (REMERON ) 7.5 MG tablet    3. GAD (generalized anxiety disorder)  F41.1 hydrOXYzine  (ATARAX ) 25 MG tablet    pregabalin  (LYRICA ) 300 MG capsule    4. Bipolar I disorder, most recent episode mixed (HCC)  F31.60 Iloperidone  (FANAPT ) 2 MG TABS    5. PTSD (post-traumatic stress disorder)  F43.10 busPIRone  (BUSPAR ) 15 MG tablet      Receiving Psychotherapy: No    RECOMMENDATIONS:   Greater than 50% of  40 min telephonic time with patient was spent on counseling and coordination of care.  We discussed her report of recent decline.  Feels like her anxiety and depression worsened recently.  She has questions about increasing her Adderall .  Says that her anxiety levels typically increase if attention and focus are poor.  Relates that her discussed  potential benefits, risks, and side effects of Fanapt  for mood stabilization, anxiety, and insomnia. Reviewed potential adverse effects with atypical antipsychotics. Discussed that Fanapt  has less risk of metabolic side effects compared to many other atypical antipsychotics.   We agreed:  To increase  Fanapt  2 mg twice daily (4 mg total)..  Continue Hydroxyzine  25 mg at bedtime  for insomnia and anxiety.  Continue Ambien  10 mg 1/2-1 tablet at bedtime for insomnia. Will start Mirtazapine  7.5-15 mg at bedtime daily for sleep. Increase  Adderall  XR 25 mg daily for ADHD.  Patient advised to contact office with any questions, adverse effects, or acute worsening in signs and symptoms. To follow up in 4 weeks per pt Provided emergency contact information Reviewed PDMP    Julie DELENA Pizza, NP      [1]  Current Outpatient Medications:    Iloperidone  (FANAPT ) 2 MG TABS, Take one tablet by mouth twice daily., Disp: 60 tablet, Rfl: 1   pregabalin  (LYRICA ) 300 MG capsule, Take 1 capsule (300 mg total) by mouth daily., Disp: 90 capsule, Rfl: 1   albuterol  (VENTOLIN  HFA) 108 (90 Base) MCG/ACT inhaler, Inhale 2 puffs into the lungs every 6 (six) hours as needed for wheezing or shortness of breath., Disp: 8 g, Rfl: 2   amphetamine -dextroamphetamine (ADDERALL  XR) 25 MG 24 hr capsule, Take 1 capsule by mouth every morning., Disp: 30 capsule, Rfl: 0   amphetamine -dextroamphetamine (ADDERALL  XR) 25 MG 24 hr capsule, Take 1 capsule by mouth every morning., Disp: 30 capsule, Rfl: 0   busPIRone  (BUSPAR ) 15 MG tablet, Take 1 tablet (15 mg total) by mouth 2 (two) times daily., Disp: 180 tablet, Rfl: 1   Continuous Glucose Sensor (FREESTYLE LIBRE 3 PLUS SENSOR) MISC, Change sensor every 15 days., Disp: 2 each, Rfl: 3   EPINEPHrine  0.3 mg/0.3 mL IJ SOAJ injection, Inject 0.3 mg into the muscle as needed for anaphylaxis., Disp: 1 each, Rfl: 3   hydrOXYzine  (ATARAX ) 25 MG tablet, TAKE 1 TABLET BY MOUTH EVERYDAY AT BEDTIME, Disp: 90 tablet, Rfl: 1   Iloperidone  (FANAPT ) 2 MG TABS, Take 1 tablet (2 mg total) by mouth at bedtime., Disp: 30 tablet, Rfl: 3   insulin  glargine, 2 Unit Dial , (TOUJEO  MAX SOLOSTAR) 300 UNIT/ML Solostar Pen, Inject 40 Units into the skin daily., Disp: 3.2 mL, Rfl: 4   insulin  lispro (HUMALOG  KWIKPEN) 100 UNIT/ML KwikPen, Inject 10 Units into the skin with breakfast, with  lunch, and with evening meal., Disp: 9 mL, Rfl: 3   Insulin  Pen Needle 32G X 6 MM MISC, 1 each by Does not apply route daily., Disp: 100 each, Rfl: 1   losartan  (COZAAR ) 25 MG tablet, Take 1 tablet (25 mg total) by mouth daily., Disp: 90 tablet, Rfl: 1   mirtazapine  (REMERON ) 7.5 MG tablet, Take 1-2 tablets by mouth daily at bedtime for sleep, Disp: 60 tablet, Rfl: 0   pantoprazole  (PROTONIX ) 40 MG tablet, Take 1 tablet (40 mg total) by mouth daily., Disp: 30 tablet, Rfl: 2   pregabalin  (LYRICA ) 200 MG capsule, Take 1 capsule (200 mg total) by mouth 3 (three) times daily., Disp: 90 capsule, Rfl: 5   zolpidem  (AMBIEN ) 10 MG tablet, TAKE 1/2-1 TABLET BY MOUTH AT BEDTIME, Disp: 30 tablet, Rfl: 5  "

## 2024-12-24 ENCOUNTER — Other Ambulatory Visit: Payer: Self-pay

## 2024-12-24 ENCOUNTER — Telehealth: Payer: Self-pay | Admitting: Behavioral Health

## 2024-12-24 DIAGNOSIS — F4310 Post-traumatic stress disorder, unspecified: Secondary | ICD-10-CM

## 2024-12-24 DIAGNOSIS — F319 Bipolar disorder, unspecified: Secondary | ICD-10-CM

## 2024-12-24 MED ORDER — PREGABALIN 200 MG PO CAPS: 200.0000 mg | ORAL_CAPSULE | Freq: Three times a day (TID) | ORAL | 1 refills | Status: AC

## 2024-12-24 NOTE — Telephone Encounter (Signed)
Pended corrected Rx.

## 2024-12-24 NOTE — Telephone Encounter (Signed)
 Pt called today at 9:35a.  She had appt yesterdy with [facility].  She told him the wrong dosage on her Lyrica .  She takes 600mg , not 300mg .  So she would like the script he sent yesterday changed to read 600mg .  Next appt 2/20

## 2024-12-28 ENCOUNTER — Ambulatory Visit: Admitting: Orthopedic Surgery

## 2024-12-30 ENCOUNTER — Other Ambulatory Visit: Payer: Self-pay | Admitting: Behavioral Health

## 2024-12-30 DIAGNOSIS — G4700 Insomnia, unspecified: Secondary | ICD-10-CM

## 2025-01-18 ENCOUNTER — Ambulatory Visit: Admitting: Internal Medicine

## 2025-01-21 ENCOUNTER — Ambulatory Visit: Admitting: Behavioral Health
